# Patient Record
Sex: Male | Born: 1970 | Race: Black or African American | Hispanic: No | Marital: Single | Smoking: Current every day smoker
Health system: Southern US, Community
[De-identification: ages and names within clinical notes are randomized; demographics above are authoritative.]

## PROBLEM LIST (undated history)

## (undated) DIAGNOSIS — I200 Unstable angina: Secondary | ICD-10-CM

## (undated) DIAGNOSIS — I10 Essential (primary) hypertension: Secondary | ICD-10-CM

## (undated) DIAGNOSIS — I2510 Atherosclerotic heart disease of native coronary artery without angina pectoris: Secondary | ICD-10-CM

## (undated) HISTORY — PX: OTHER SURGICAL HISTORY: SHX169

## (undated) HISTORY — DX: Unstable angina: I20.0

## (undated) HISTORY — DX: Essential (primary) hypertension: I10

## (undated) HISTORY — PX: CORONARY STENT PLACEMENT: SHX1402

## (undated) HISTORY — DX: Atherosclerotic heart disease of native coronary artery without angina pectoris: I25.10

---

## 2006-01-30 ENCOUNTER — Emergency Department (HOSPITAL_COMMUNITY): Admission: EM | Admit: 2006-01-30 | Discharge: 2006-01-30 | Payer: Self-pay | Admitting: Emergency Medicine

## 2006-02-05 ENCOUNTER — Emergency Department (HOSPITAL_COMMUNITY): Admission: EM | Admit: 2006-02-05 | Discharge: 2006-02-05 | Payer: Self-pay | Admitting: Emergency Medicine

## 2006-07-20 ENCOUNTER — Emergency Department (HOSPITAL_COMMUNITY): Admission: EM | Admit: 2006-07-20 | Discharge: 2006-07-20 | Payer: Self-pay | Admitting: Emergency Medicine

## 2006-07-22 ENCOUNTER — Emergency Department (HOSPITAL_COMMUNITY): Admission: EM | Admit: 2006-07-22 | Discharge: 2006-07-22 | Payer: Self-pay | Admitting: Emergency Medicine

## 2006-07-25 ENCOUNTER — Observation Stay (HOSPITAL_COMMUNITY): Admission: RE | Admit: 2006-07-25 | Discharge: 2006-07-26 | Payer: Self-pay | Admitting: Family Medicine

## 2006-07-25 ENCOUNTER — Encounter (INDEPENDENT_AMBULATORY_CARE_PROVIDER_SITE_OTHER): Payer: Self-pay | Admitting: Specialist

## 2006-07-25 ENCOUNTER — Emergency Department (HOSPITAL_COMMUNITY): Admission: EM | Admit: 2006-07-25 | Discharge: 2006-07-25 | Payer: Self-pay | Admitting: Family Medicine

## 2007-01-14 ENCOUNTER — Emergency Department (HOSPITAL_COMMUNITY): Admission: EM | Admit: 2007-01-14 | Discharge: 2007-01-14 | Payer: Self-pay | Admitting: Emergency Medicine

## 2007-03-14 ENCOUNTER — Emergency Department (HOSPITAL_COMMUNITY): Admission: EM | Admit: 2007-03-14 | Discharge: 2007-03-14 | Payer: Self-pay | Admitting: Emergency Medicine

## 2007-04-24 ENCOUNTER — Ambulatory Visit: Payer: Self-pay | Admitting: Cardiology

## 2007-05-13 ENCOUNTER — Ambulatory Visit: Payer: Self-pay | Admitting: Cardiology

## 2007-05-13 ENCOUNTER — Ambulatory Visit: Payer: Self-pay

## 2007-05-13 LAB — CONVERTED CEMR LAB
BUN: 10 mg/dL (ref 6–23)
Basophils Absolute: 0 10*3/uL (ref 0.0–0.1)
Basophils Relative: 0.1 % (ref 0.0–1.0)
CO2: 29 meq/L (ref 19–32)
Calcium: 9.2 mg/dL (ref 8.4–10.5)
Chloride: 106 meq/L (ref 96–112)
Creatinine, Ser: 0.9 mg/dL (ref 0.4–1.5)
Eosinophils Absolute: 0.2 10*3/uL (ref 0.0–0.6)
Eosinophils Relative: 1.4 % (ref 0.0–5.0)
GFR calc Af Amer: 123 mL/min
GFR calc non Af Amer: 102 mL/min
Glucose, Bld: 104 mg/dL — ABNORMAL HIGH (ref 70–99)
HCT: 42.3 % (ref 39.0–52.0)
Hemoglobin: 14.9 g/dL (ref 13.0–17.0)
INR: 0.8 (ref 0.8–1.0)
Lymphocytes Relative: 37.1 % (ref 12.0–46.0)
MCHC: 35.1 g/dL (ref 30.0–36.0)
MCV: 92.1 fL (ref 78.0–100.0)
Monocytes Absolute: 1.1 10*3/uL — ABNORMAL HIGH (ref 0.2–0.7)
Monocytes Relative: 10.1 % (ref 3.0–11.0)
Neutro Abs: 5.6 10*3/uL (ref 1.4–7.7)
Neutrophils Relative %: 51.3 % (ref 43.0–77.0)
Platelets: 203 10*3/uL (ref 150–400)
Potassium: 4.1 meq/L (ref 3.5–5.1)
Prothrombin Time: 10.8 s — ABNORMAL LOW (ref 10.9–13.3)
RBC: 4.6 M/uL (ref 4.22–5.81)
RDW: 13.1 % (ref 11.5–14.6)
Sodium: 140 meq/L (ref 135–145)
WBC: 10.9 10*3/uL — ABNORMAL HIGH (ref 4.5–10.5)
aPTT: 31 s — ABNORMAL HIGH

## 2007-05-15 ENCOUNTER — Encounter (INDEPENDENT_AMBULATORY_CARE_PROVIDER_SITE_OTHER): Payer: Self-pay | Admitting: Nurse Practitioner

## 2007-05-15 ENCOUNTER — Ambulatory Visit: Payer: Self-pay | Admitting: Cardiovascular Disease

## 2007-05-15 ENCOUNTER — Ambulatory Visit (HOSPITAL_COMMUNITY): Admission: RE | Admit: 2007-05-15 | Discharge: 2007-05-16 | Payer: Self-pay | Admitting: Cardiovascular Disease

## 2007-05-15 HISTORY — PX: CARDIAC CATHETERIZATION: SHX172

## 2007-05-15 LAB — CONVERTED CEMR LAB
BUN: 10 mg/dL
Creatinine, Ser: 0.88 mg/dL
HCT: 41.9 %
Hemoglobin: 14.3 g/dL
Platelets: 202 10*3/uL
Potassium: 3.7 meq/L
Sodium: 139 meq/L
WBC: 10.3 10*3/uL

## 2007-05-16 ENCOUNTER — Ambulatory Visit: Payer: Self-pay | Admitting: Vascular Surgery

## 2007-05-16 DIAGNOSIS — I200 Unstable angina: Secondary | ICD-10-CM | POA: Insufficient documentation

## 2007-06-04 ENCOUNTER — Ambulatory Visit: Payer: Self-pay | Admitting: Nurse Practitioner

## 2007-06-04 DIAGNOSIS — I10 Essential (primary) hypertension: Secondary | ICD-10-CM | POA: Insufficient documentation

## 2007-06-04 DIAGNOSIS — I2510 Atherosclerotic heart disease of native coronary artery without angina pectoris: Secondary | ICD-10-CM | POA: Insufficient documentation

## 2007-06-04 LAB — CONVERTED CEMR LAB
ALT: 45 units/L (ref 0–53)
AST: 25 units/L (ref 0–37)
Albumin: 4.4 g/dL (ref 3.5–5.2)
Alkaline Phosphatase: 63 units/L (ref 39–117)
BUN: 11 mg/dL (ref 6–23)
CO2: 23 meq/L (ref 19–32)
Calcium: 9.5 mg/dL (ref 8.4–10.5)
Chloride: 104 meq/L (ref 96–112)
Cholesterol: 139 mg/dL (ref 0–200)
Creatinine, Ser: 0.84 mg/dL (ref 0.40–1.50)
Glucose, Bld: 81 mg/dL (ref 70–99)
HDL: 42 mg/dL (ref >39)
LDL Cholesterol: 78 mg/dL (ref 0–99)
Potassium: 4.2 meq/L (ref 3.5–5.3)
Sodium: 140 meq/L (ref 135–145)
Total Bilirubin: 0.5 mg/dL (ref 0.3–1.2)
Total CHOL/HDL Ratio: 3.3
Total Protein: 7.9 g/dL (ref 6.0–8.3)
Triglycerides: 93 mg/dL (ref <150)
VLDL: 19 mg/dL (ref 0–40)

## 2007-06-05 ENCOUNTER — Encounter (INDEPENDENT_AMBULATORY_CARE_PROVIDER_SITE_OTHER): Payer: Self-pay | Admitting: Nurse Practitioner

## 2007-06-11 ENCOUNTER — Ambulatory Visit: Payer: Self-pay | Admitting: Cardiology

## 2007-10-12 ENCOUNTER — Ambulatory Visit: Payer: Self-pay | Admitting: *Deleted

## 2007-11-06 ENCOUNTER — Ambulatory Visit: Payer: Self-pay | Admitting: Nurse Practitioner

## 2007-11-09 ENCOUNTER — Encounter (INDEPENDENT_AMBULATORY_CARE_PROVIDER_SITE_OTHER): Payer: Self-pay | Admitting: Nurse Practitioner

## 2007-11-11 ENCOUNTER — Ambulatory Visit: Payer: Self-pay | Admitting: Cardiology

## 2007-11-11 LAB — CONVERTED CEMR LAB
ALT: 45 units/L (ref 0–53)
AST: 35 units/L (ref 0–37)
Albumin: 3.8 g/dL (ref 3.5–5.2)
Alkaline Phosphatase: 57 units/L (ref 39–117)
Bilirubin, Direct: 0.1 mg/dL (ref 0.0–0.3)
Cholesterol: 158 mg/dL (ref 0–200)
HDL: 43.9 mg/dL (ref >39.0)
LDL Cholesterol: 101 mg/dL — ABNORMAL HIGH (ref 0–99)
Total Bilirubin: 0.5 mg/dL (ref 0.3–1.2)
Total CHOL/HDL Ratio: 3.6
Total Protein: 7.8 g/dL (ref 6.0–8.3)
Triglycerides: 66 mg/dL (ref 0–149)
VLDL: 13 mg/dL (ref 0–40)

## 2008-07-08 ENCOUNTER — Ambulatory Visit: Payer: Self-pay | Admitting: Cardiology

## 2008-09-13 IMAGING — US US ART/VEN ABD/PELV/SCROTUM DOPPLER COMPLETE
1 series · 13 of 25 positions shown · non-contrast
Comparison: none

HISTORY: Left testicular swelling and pain, question torsion

[Series 1: unknown · 0.09mm/px · 13 of 42 slices shown]
[im 1/42]
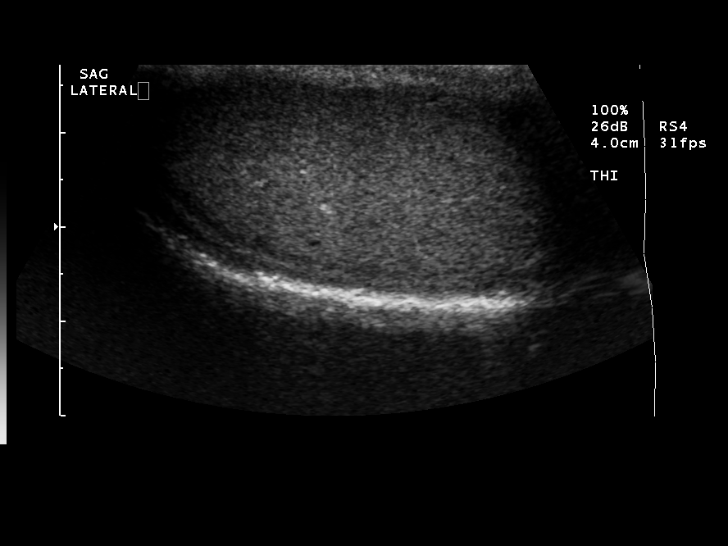
[im 4/42]
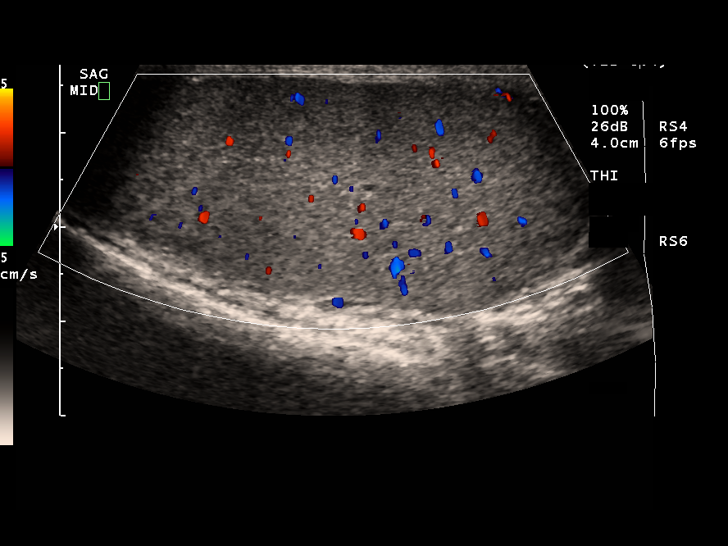
[im 7/42]
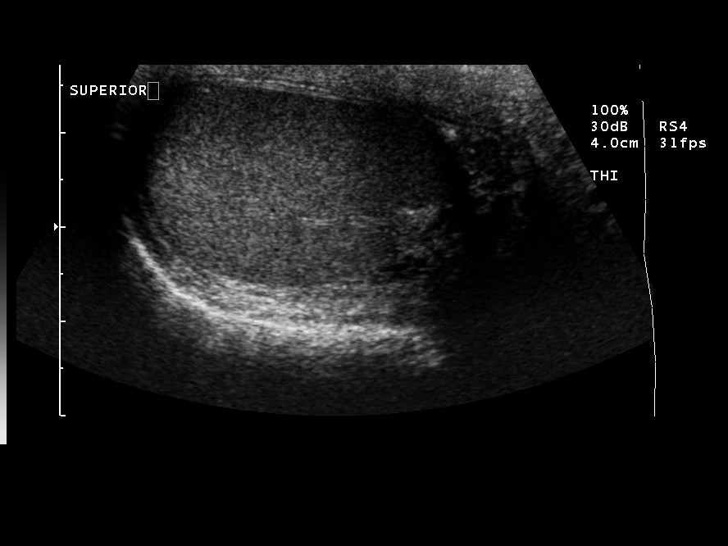
[im 11/42]
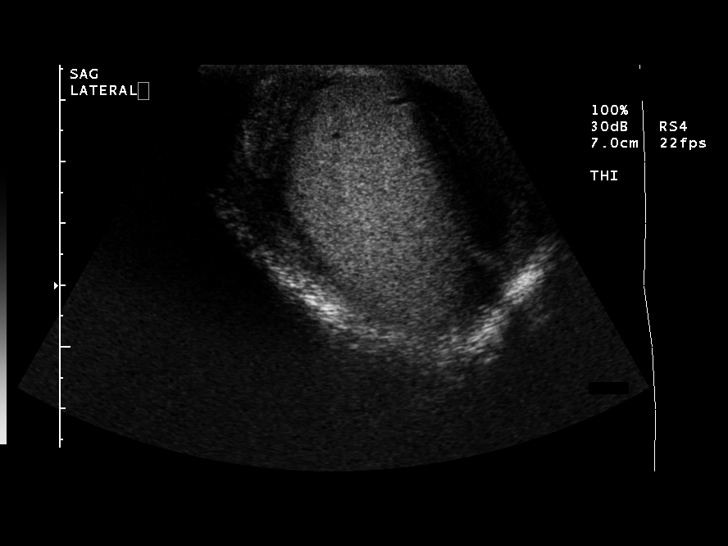
[im 14/42]
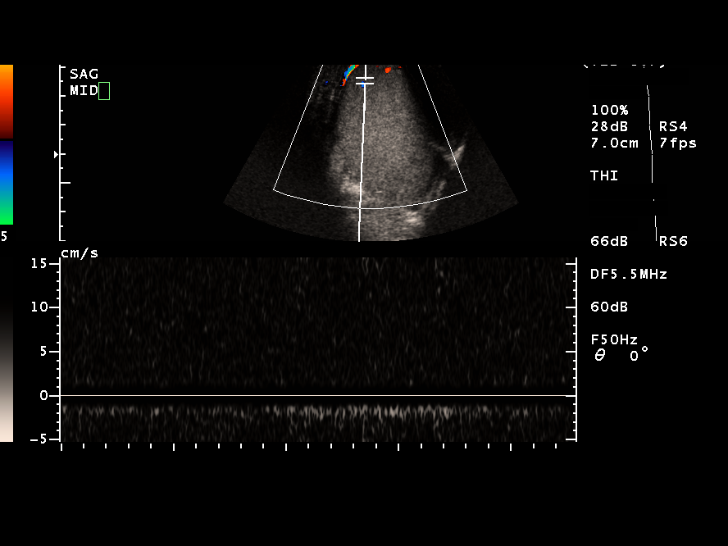
[im 18/42]
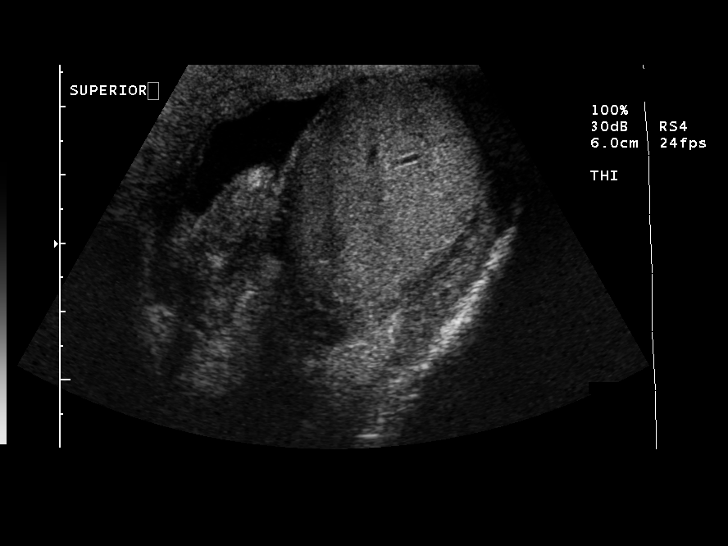
[im 21/42]
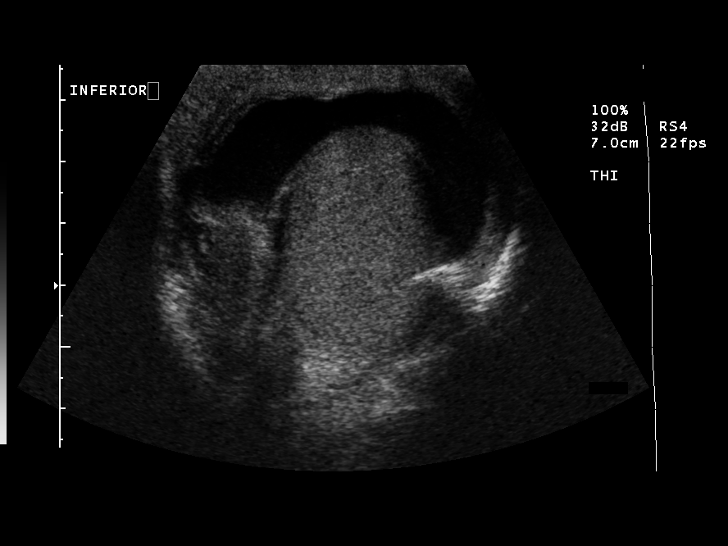
[im 24/42]
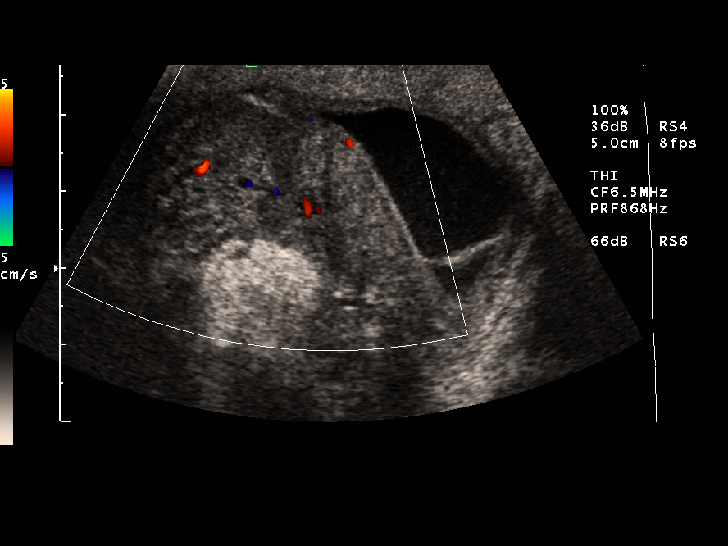
[im 28/42]
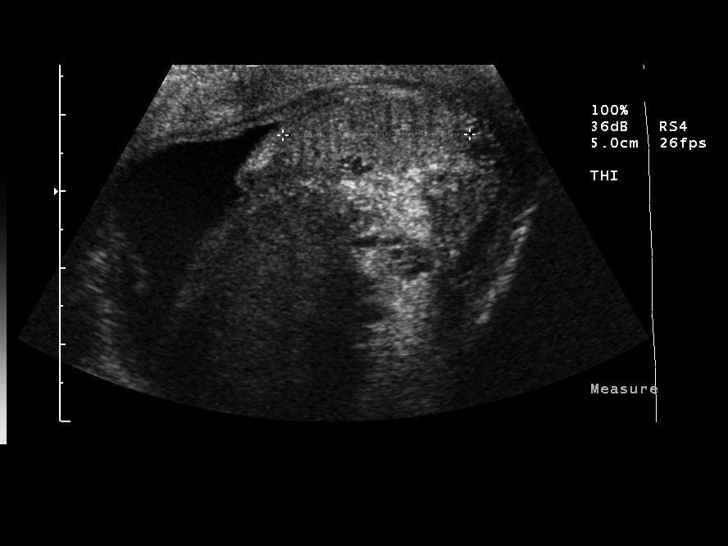
[im 31/42]
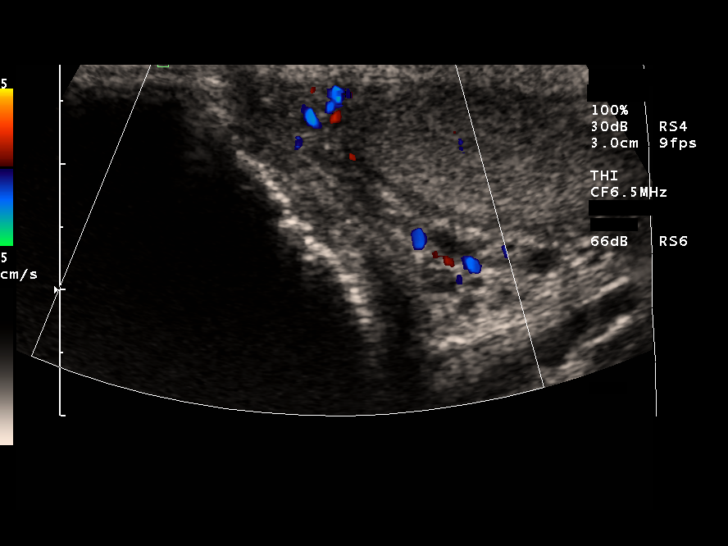
[im 35/42]
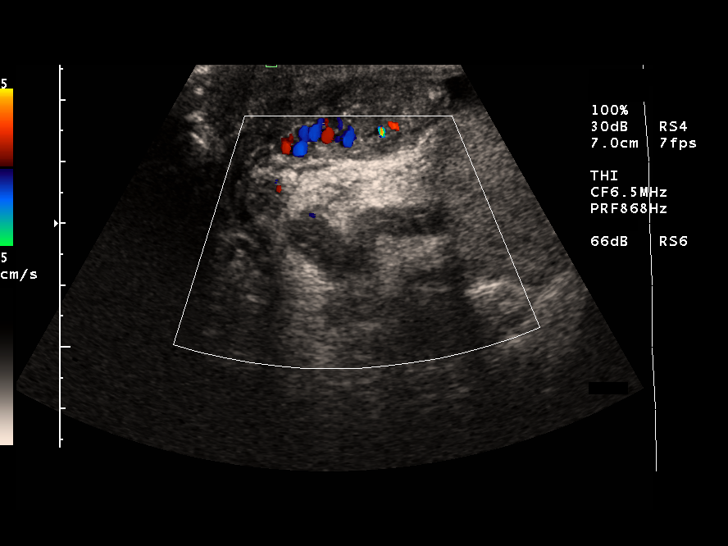
[im 38/42]
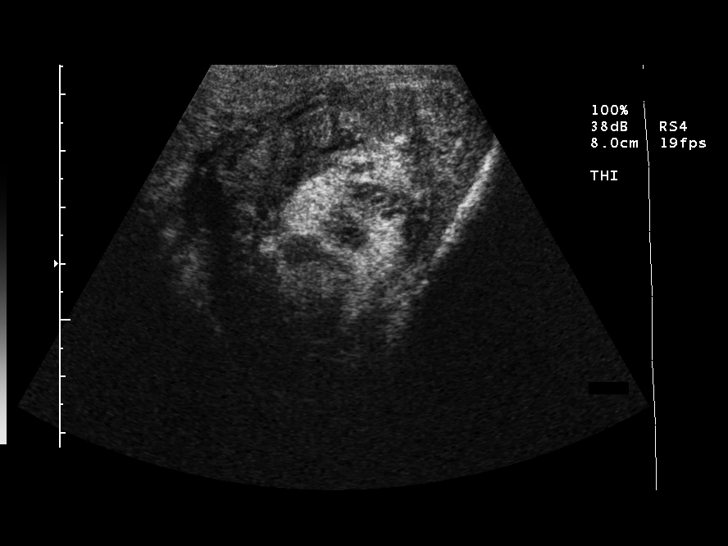
[im 42/42]
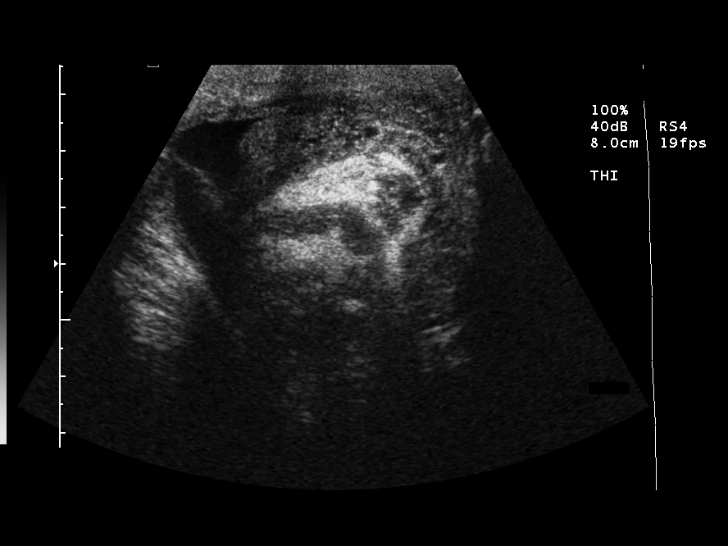

[13 of 25 positions shown; findings below may reference images not displayed]

BILATERAL TESTICULAR ULTRASOUND:
ULTRASOUND ARTERIAL AND VENOUS DUPLEX IMAGING BILATERAL TESTES:

Grayscale, color Doppler, and pulse wave imaging of both testes performed.
The reports from both exams are combined below, in separate paragraphs, for
clarity.

Right testis 5.5 x 5.2 x 3.6 cm.
Left testis 5.1 x 3.5 x 3.1 cm.
Normal symmetric intratesticular blood flow bilaterally.
No evidence of intratesticular mass, calcification, or cyst.
Right epididymis unremarkable.
Diffuse enlargement of left epididymis.
Left hydrocele containing echogenic material/debris.
No right hydrocele.
On left, hyperechoic focus identified adjacent to left epididymis, approximately
3 cm diameter, suspicious for fat.
This could represent a lipoma or fat within a left inguinal hernia.
No peristalsis seen with this abnormality.

Symmetric intratesticular blood flow bilaterally on color Doppler imaging.
No evidence of testicular torsion.
Arterial and venous waveforms present in both testes on pulse wave analysis.
Hyperemia seen in left epididymis compatible with epididymitis.
No varicocele.
IMPRESSION: Left epididymitis.
Complex left hydrocele, cannot exclude pyocele.
Hyperechoic focus extending adjacent to left epididymis, question left inguinal
hernia containing fat.
No evidence of testicular torsion.

## 2008-09-18 IMAGING — US US ART/VEN ABD/PELV/SCROTUM DOPPLER COMPLETE
1 series · 13 of 25 positions shown · non-contrast
Comparison: none

CLINICAL DATA: Left testicle swelling and pain for 5-6 days.
SCROTAL ULTRASOUND:
DOPPLER ULTRASOUND OF THE TESTICLES:
TECHNIQUE: Complete ultrasound examination of the testicles, epididymis, and other scrotal structures was performed.  Color and spectral Doppler ultrasound were also utilized to evaluate blood flow to the testicles.

[Series 1: unknown · 0.11mm/px · 13 of 40 slices shown]
[im 1/40]
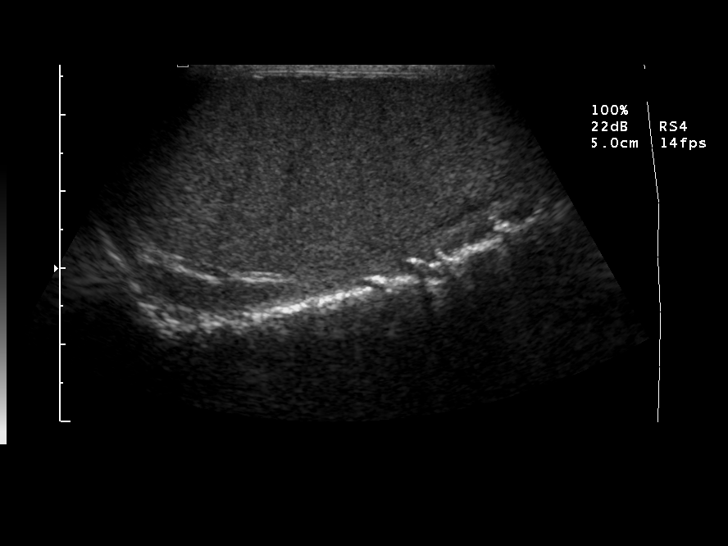
[im 4/40]
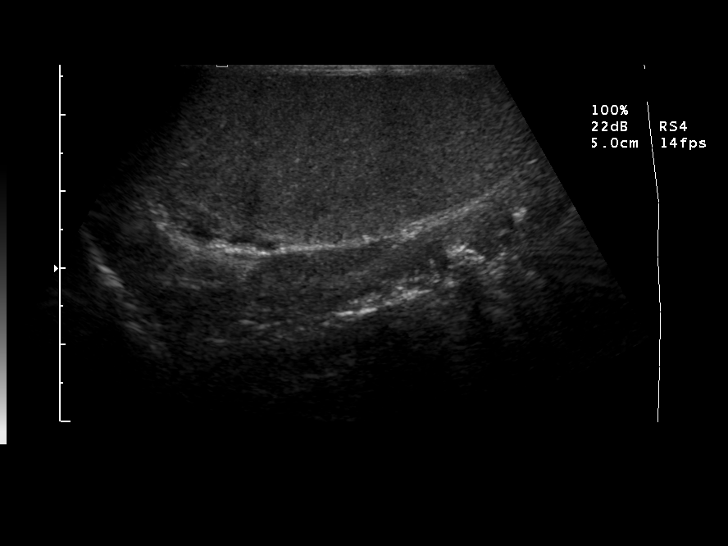
[im 7/40]
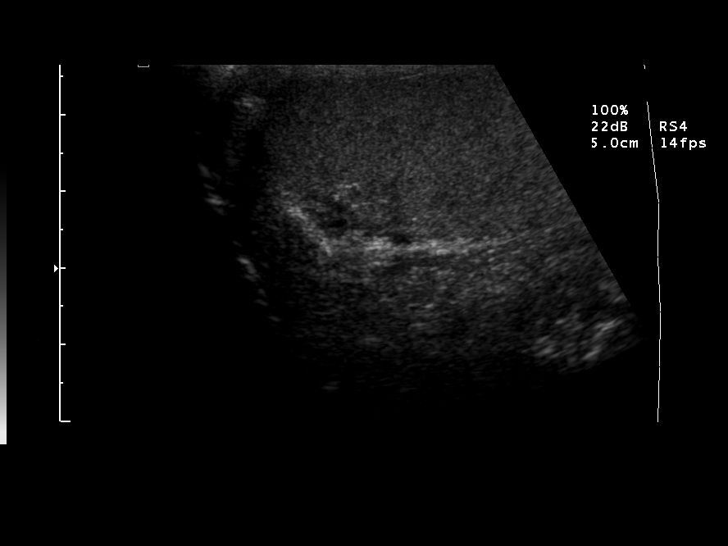
[im 10/40]
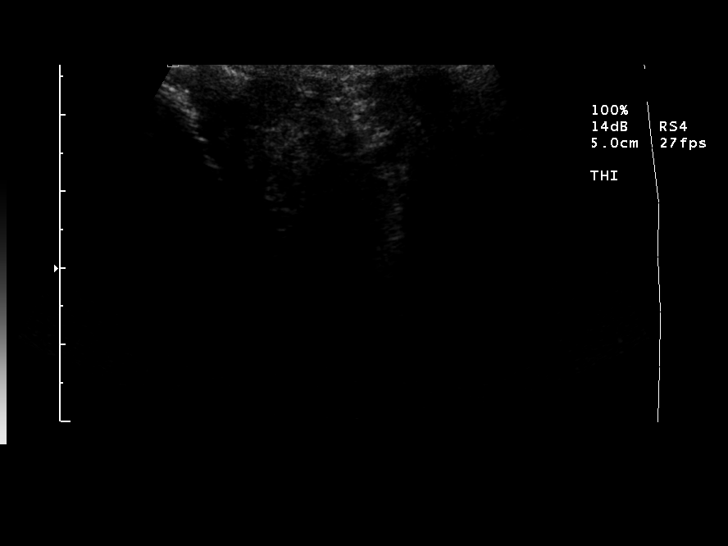
[im 14/40]
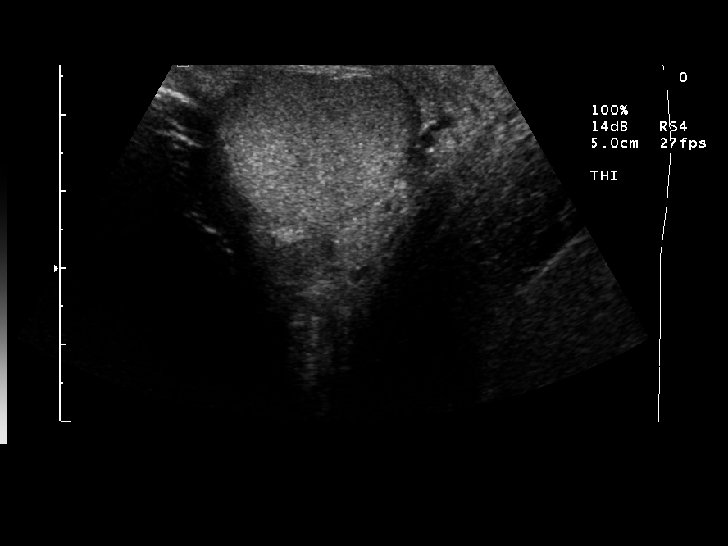
[im 17/40]
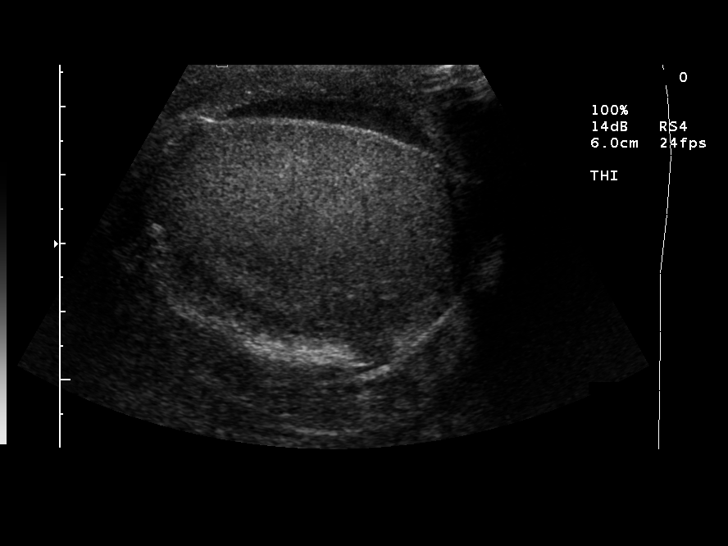
[im 20/40]
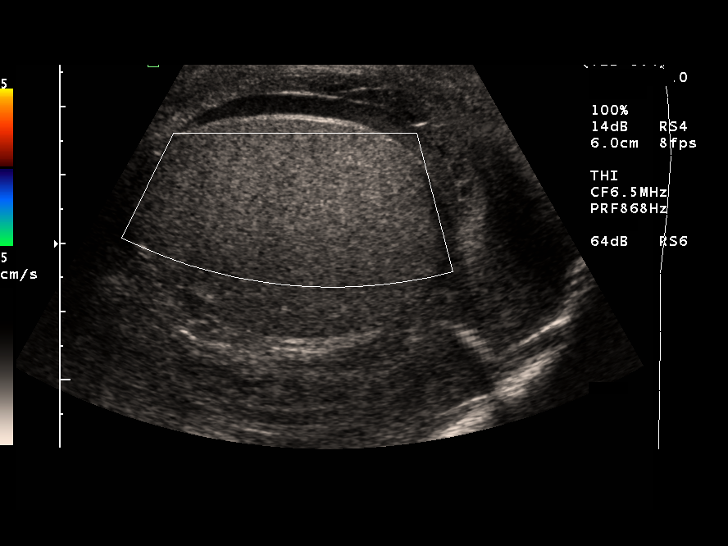
[im 23/40]
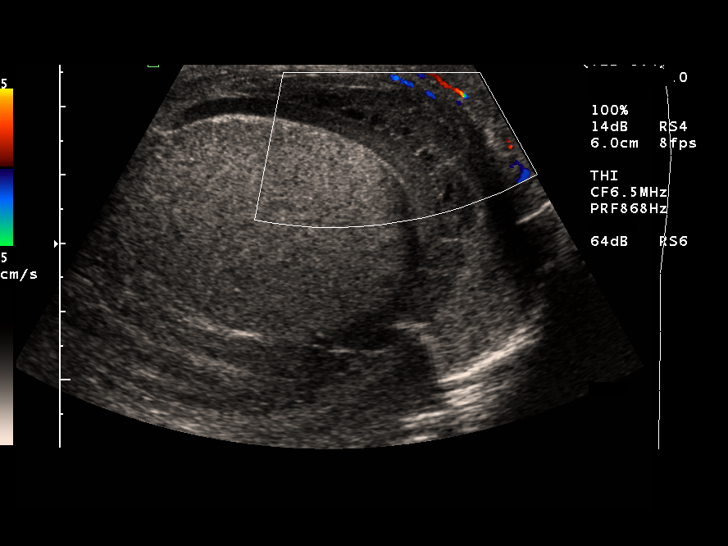
[im 27/40]
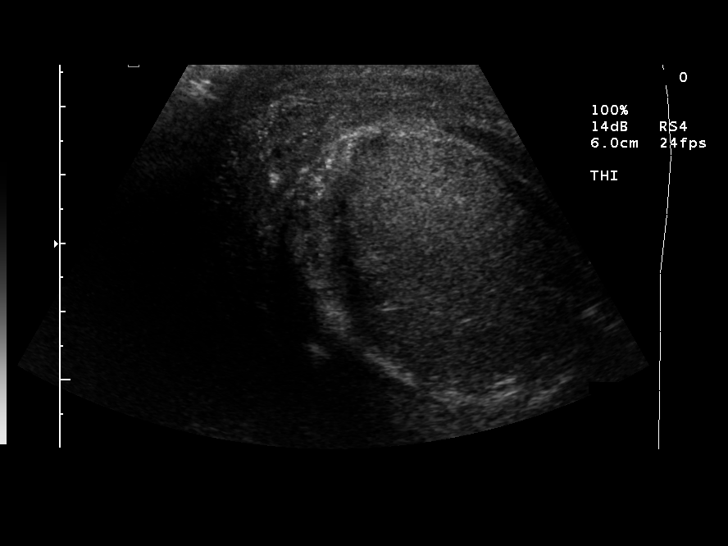
[im 30/40]
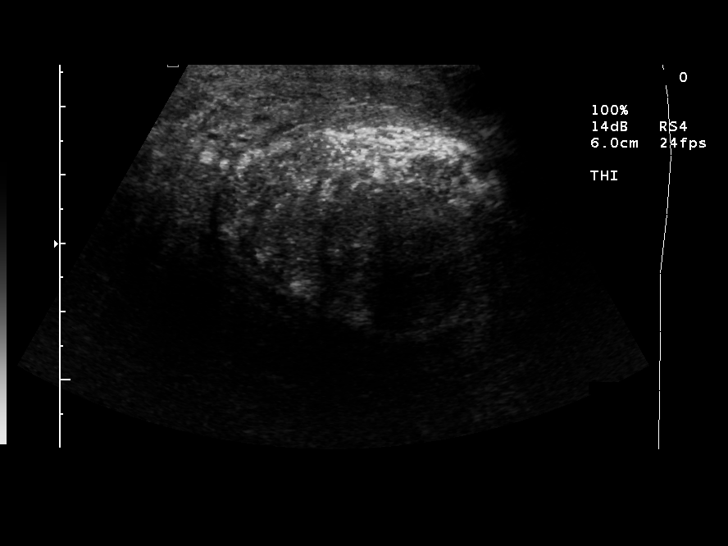
[im 33/40]
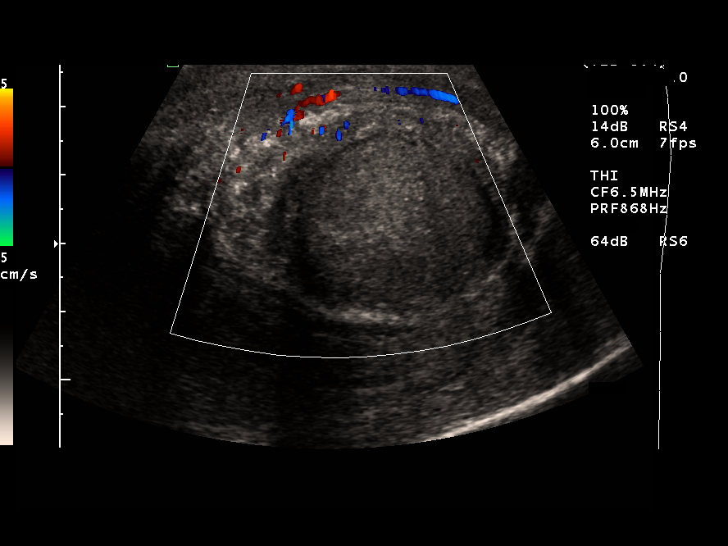
[im 36/40]
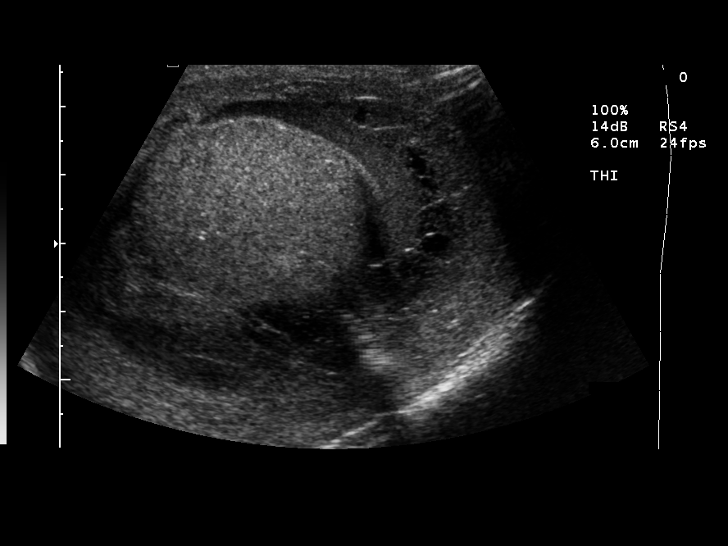
[im 40/40]
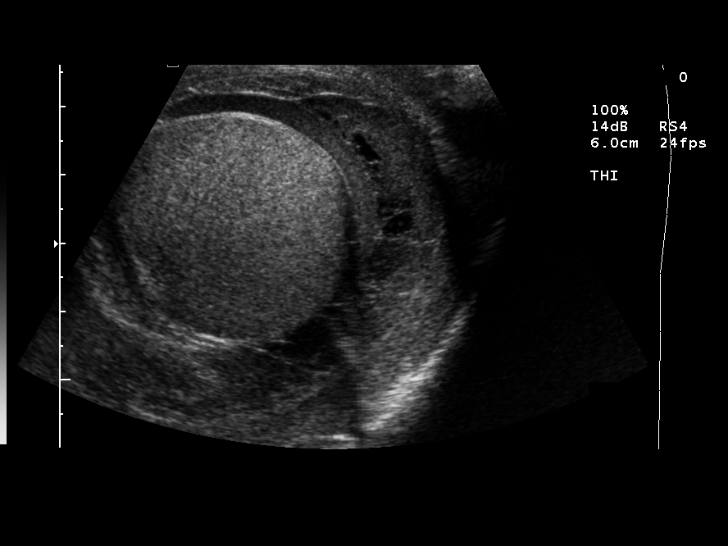

[13 of 25 positions shown; findings below may reference images not displayed]

FINDINGS: The right testicle measures 5.4 cm in length and 2.8 x 3.3 cm in AP and width dimensions. Normal color Doppler flow and arterial and venous wave forms for the right testicle. 
The left testicle measures 5.8 cm in length and 3.1 x 3.1 cm in AP and width dimensions. No color Doppler flow or arterial or venous wave forms for the left testicle. There does appear to be some peripheral flow within the skin of the left scrotum and some flow in the epididymis. There is a complex left hydrocele with multiple small locules and septations. This may represent a pyocele or hematocele. The left testicle appears generally hypoechoic compared to the right testicle. 
Interval change in character of the complex left hydrocele.  There is a more organized appearance now suggesting formation of a pyocele/hematocele measuring approximately 3.0 x 4.2   x 3.2 cm.
IMPRESSION: No evidence of vascular flow to the left testicle. The findings would be compatible with ischemia or infarction of the left testicle. This could be a result of torsion, but also compression causing ischemia in this patient with a complex left hydrocele. 
Note that 07/20/06 ultrasound revealed symmetric intratesticular blood flow bilaterally, whereas, today?s scan reveals no evidence of vascular flow the left testicle.

## 2009-05-02 ENCOUNTER — Encounter (INDEPENDENT_AMBULATORY_CARE_PROVIDER_SITE_OTHER): Payer: Self-pay | Admitting: *Deleted

## 2010-02-06 ENCOUNTER — Emergency Department (HOSPITAL_COMMUNITY): Admission: EM | Admit: 2010-02-06 | Discharge: 2010-02-06 | Payer: Self-pay | Admitting: Emergency Medicine

## 2010-03-20 ENCOUNTER — Emergency Department (HOSPITAL_COMMUNITY): Admission: EM | Admit: 2010-03-20 | Discharge: 2010-03-20 | Payer: Self-pay | Admitting: Family Medicine

## 2010-04-17 ENCOUNTER — Encounter
Admission: RE | Admit: 2010-04-17 | Discharge: 2010-07-16 | Payer: Self-pay | Source: Home / Self Care | Admitting: Orthopedic Surgery

## 2010-04-26 ENCOUNTER — Ambulatory Visit (HOSPITAL_COMMUNITY): Admission: EM | Admit: 2010-04-26 | Discharge: 2010-04-26 | Payer: Self-pay | Admitting: Emergency Medicine

## 2010-09-30 LAB — CONVERTED CEMR LAB
ALT: 35 units/L (ref 0–53)
AST: 22 units/L (ref 0–37)
Albumin: 4.4 g/dL (ref 3.5–5.2)
Alkaline Phosphatase: 67 units/L (ref 39–117)
BUN: 16 mg/dL (ref 6–23)
Basophils Absolute: 0 10*3/uL (ref 0.0–0.1)
Basophils Relative: 0 % (ref 0–1)
Bilirubin Urine: NEGATIVE
Blood in Urine, dipstick: NEGATIVE
CO2: 24 meq/L (ref 19–32)
Calcium: 9.5 mg/dL (ref 8.4–10.5)
Chlamydia, Swab/Urine, PCR: NEGATIVE
Chloride: 106 meq/L (ref 96–112)
Cholesterol: 139 mg/dL (ref 0–200)
Creatinine, Ser: 0.82 mg/dL (ref 0.40–1.50)
Eosinophils Absolute: 0.2 10*3/uL (ref 0.0–0.7)
Eosinophils Relative: 2 % (ref 0–5)
GC Probe Amp, Urine: NEGATIVE
Glucose, Bld: 89 mg/dL (ref 70–99)
Glucose, Urine, Semiquant: NEGATIVE
HCT: 44.6 % (ref 39.0–52.0)
HDL: 44 mg/dL (ref >39)
Hemoglobin: 14.2 g/dL (ref 13.0–17.0)
Ketones, urine, test strip: NEGATIVE
LDL Cholesterol: 78 mg/dL (ref 0–99)
Lymphocytes Relative: 47 % — ABNORMAL HIGH (ref 12–46)
Lymphs Abs: 5.1 10*3/uL — ABNORMAL HIGH (ref 0.7–4.0)
MCHC: 31.8 g/dL (ref 30.0–36.0)
MCV: 94.9 fL (ref 78.0–100.0)
Monocytes Absolute: 0.9 10*3/uL (ref 0.1–1.0)
Monocytes Relative: 8 % (ref 3–12)
Neutro Abs: 4.8 10*3/uL (ref 1.7–7.7)
Neutrophils Relative %: 44 % (ref 43–77)
Nitrite: NEGATIVE
Platelets: 282 10*3/uL (ref 150–400)
Potassium: 4.4 meq/L (ref 3.5–5.3)
Protein, U semiquant: NEGATIVE
RBC: 4.7 M/uL (ref 4.22–5.81)
RDW: 13.9 % (ref 11.5–15.5)
Sodium: 140 meq/L (ref 135–145)
Specific Gravity, Urine: >=1.03
TSH: 1.077 microintl units/mL (ref 0.350–5.50)
Total Bilirubin: 0.3 mg/dL (ref 0.3–1.2)
Total CHOL/HDL Ratio: 3.2
Total Protein: 7.6 g/dL (ref 6.0–8.3)
Triglycerides: 86 mg/dL (ref <150)
Urobilinogen, UA: 0.2
VLDL: 17 mg/dL (ref 0–40)
WBC Urine, dipstick: NEGATIVE
WBC: 10.9 10*3/uL — ABNORMAL HIGH (ref 4.0–10.5)
pH: 5.5

## 2010-10-02 NOTE — Assessment & Plan Note (Signed)
Summary: New pt/CAD   Vital Signs:  Patient Profile:   40 Years Old Male Height:     65.5 inches Weight:      219 pounds BMI:     36.02 BSA:     2.07 Temp:     98.4 degrees F oral Pulse rate:   78 / minute Pulse rhythm:   regular Resp:     18 per minute BP sitting:   132 / 82  (right arm) Cuff size:   regular  Pt. in pain?   no  Vitals Entered By: Gaylyn Cheers RN (June 04, 2007 11:20 AM)              Is Patient Diabetic? No  Does patient need assistance? Ambulation Normal Comments takes med "to thin his blood, starts with P", "something for cholesterol, starts with S" given by Dr. while he was in the hospital  1 ASA 325mg  daily, recently D/C from hospital "had a stent put in"     Chief Complaint:  here to establish.  History of Present Illness: Pt is here today presenting for initial visit to establish care.  he states that he is doing well s/p his hospitalization  Pt was seen in hospital on 05/16/03 for unstable angina.  he had cardiac cath by Dr. Excell Seltzer. Pt had nonobstructive LAD disease and 95% mid circumflex.  stent placed.  pt is to take lifelong ASA and plavix for 30 days.  Pt had u/a secondary to right groin bruit. no evidence of pseudoaneurysm or fistula. Pt was to arrange f/u with Dr. Antoine Poche.  Pt has an appointment on 06/11/07. Pt need labs today as per d/c visit.    Hypertension History:      Positive major cardiovascular risk factors include hypertension and current tobacco user.  Negative major cardiovascular risk factors include male age less than 66 years old and negative family history for ischemic heart disease.        Positive history for target organ damage include ASHD (either angina; prior MI; prior CABG).     Current Allergies: No known allergies   Past Medical History:    HYPERTENSION, BENIGN ESSENTIAL (ICD-401.1)    CORONARY ARTERY DISEASE (ICD-414.00)    UNSTABLE ANGINA (ICD-411.1)      Past Surgical History:    cardiac cath  with stenting (05/15/07)   Social History:    Current Smoker    Drug use-yes (marijuana)   Risk Factors:  Tobacco use:  current    Cigars:  Yes    Counseled to quit/cut down tobacco use:  yes Passive smoke exposure:  yes Drug use:  yes    Substance:  marijuana    Comments:  daily HIV high-risk behavior:  no Caffeine use:  1 drinks per day Alcohol use:  yes    Type:  beer, liquor    Drinks per day:  <1    Has patient --       Felt need to cut down:  yes       Been annoyed by complaints:  no       Felt guilty about drinking:  no       Needed eye opener in the morning:  no    Counseled to quit/cut down alcohol use:  yes Exercise:  no Seatbelt use:  75 % Sun Exposure:  occasionally  Family History Risk Factors:    Family History of MI in females < 49 years old:  no    Family  History of MI in males < 1 years old:  no   Review of Systems  General      Denies chills, fatigue, and fever.  ENT      Denies sore throat.  CV      Denies chest pain or discomfort and weight gain.  Resp      Denies chest discomfort, cough, and wheezing.  GI      Denies abdominal pain, bloody stools, change in bowel habits, constipation, dark tarry stools, diarrhea, excessive appetite, gas, hemorrhoids, indigestion, loss of appetite, nausea, vomiting, vomiting blood, and yellowish skin color.   Physical Exam  General:     alert, well-developed, and overweight-appearing.   Head:     normocephalic.   Eyes:     pupils round.   Neck:     supple.   Lungs:     Normal respiratory effort, chest expands symmetrically. Lungs are clear to auscultation, no crackles or wheezes. Heart:     Normal rate and regular rhythm. S1 and S2 normal without gallop, murmur, click, rub or other extra sounds. Abdomen:     soft, non-tender, and normal bowel sounds.   obese Msk:     up to exam table Neurologic:     alert & oriented X3.      Impression & Recommendations:  Problem # 1:  CORONARY  ARTERY DISEASE (ICD-414.00) Pt is s/p cardiac cath.  He needs to f/u with cardiologist as ordered. His updated medication list for this problem includes:    Plavix 75 Mg Tabs (Clopidogrel bisulfate) .Marland Kitchen... 1 tablet by mouth daily for circulation    Nitroquick 0.4 Mg Subl (Nitroglycerin) .Marland Kitchen... As needed for chest pain  Orders: T-Comprehensive Metabolic Panel [phone]) T-Lipid Profile ([id number])   Problem # 2:  HYPERTENSION, BENIGN ESSENTIAL (ICD-401.1) no current meds. pt to control with diet and exercise.  pt aware that he may need meds in the future if bp continues with elevations.  Complete Medication List: 1)  Plavix 75 Mg Tabs (Clopidogrel bisulfate) .Marland Kitchen.. 1 tablet by mouth daily for circulation 2)  Zocor 40 Mg Tabs (Simvastatin) .Marland Kitchen.. 1 tablet by mouth at night for cholesterol 3)  Nitroquick 0.4 Mg Subl (Nitroglycerin) .... As needed for chest pain  Hypertension Assessment/Plan:      The patient's hypertensive risk group is category C: Target organ damage and/or diabetes.  Today's blood pressure is 132/82.     Patient Instructions: 1)  Follow up with cardiologist (heart doctor) as ordered. 2)  Start low cholesterol diet. 3)  Tobacco is very bad for your health and your loved ones! You Should stop smoking!Marland Kitchen 4)  Continue medication as printed below 5)  Follow here in 3 months unless otherwise instructed by cardiology.    ]

## 2010-10-02 NOTE — Letter (Signed)
Summary: FAXED RECORDS TO [facility] 11/06/07  FAXED RECORDS TO [facility] 11/06/07   Imported By: Silvio Pate Stanislawscyk 11/06/2007 16:26:23  _____________________________________________________________________  External Attachment:    Type:   Image     Comment:   External Document

## 2010-10-02 NOTE — Letter (Signed)
Summary: Discharge Summary  Discharge Summary   Imported By: Arta Bruce 06/05/2007 11:56:56  _____________________________________________________________________  External Attachment:    Type:   Image     Comment:   External Document

## 2010-10-02 NOTE — Letter (Signed)
Summary: Appointment - Reminder 2  [facility], Main Office  [address]   [place], [place] [address]   Phone: [phone]  Fax: [phone]     May 02, 2009 MRN: [id number]   JUSTINE COSSIN [address] [place], [place]  [address]   Dear Mr. RAMAKRISHNAN,  Our records indicate that it is time to schedule a follow-up appointment.  Dr. Antoine Poche  recommended that you follow up with [place] in Nov 2010. It is very important that we reach you to schedule this appointment. We look forward to participating in your health care needs. Please contact us at the number listed above at your earliest convenience to schedule your appointment.  If you are unable to make an appointment at this time, give [place] a call so we can update our records.     Sincerely,    Lorne Skeens  [facility] Scheduling Team

## 2010-10-02 NOTE — Assessment & Plan Note (Signed)
Summary: Complete Physical Exam   Vital Signs:  Patient Profile:   40 Years Old Male Height:     65.5 inches Weight:      220 pounds BMI:     36.18 BSA:     2.07 Temp:     98.3 degrees F oral Pulse rate:   82 / minute Pulse rhythm:   regular Resp:     20 per minute BP sitting:   130 / 90  (left arm) Cuff size:   regular  Pt. in pain?   no  Vitals Entered By: Levon Hedger (November 06, 2007 8:39 AM)              Is Patient Diabetic? No  Does patient need assistance? Ambulation Normal     Visit Type:  Complete Physical Exam PCP:  Lehman Prom FNP  Chief Complaint:  CPE.  History of Present Illness:  Complete Physical Exam - No problem today, only here for CPE.  tetanus - Pt has not gotten tetanus within the past 10 years.  optho - No glasses or contact. No recent optho exam.  dental - no recent dental exam. Pt is requesting to get information for the dental clinic at [facility].  CAD - s/p stenting in 2008 and was last seen by cardiology in 10/0/08.  At that time pt was having no angina.  He is still taking the plavix 75mg  daily.  Pt is notes that he bruises easily with the plavix. Appreciate Dr. Antoine Poche seeing this pt in f/u. Pt states that next appt is on 11/11/07.  No chest pain since his stenting.    Exercise - pt was recommended to exercise 5 days per week to reduce risk.  He is also on a statin and will check lipid panel today.  1 pound weight gain since the last visit here.   When questioned about tobacco use pt seem very vaque on the answer but eventually says that since he had the procedure he has not smoked.  blood pressure - currently no meds and pt is to manage with lifestyle changes. Will need to monitor for possible medication additions.     Prior Medication List:  ZOCOR 40 MG  TABS (SIMVASTATIN) 1 tablet by mouth at night for cholesterol NITROQUICK 0.4 MG  SUBL (NITROGLYCERIN) as needed for chest pain   Current Allergies: No known  allergies     Risk Factors: Tobacco use:  current    Cigars:  Yes Passive smoke exposure:  yes Drug use:  yes    Substance:  marijuana HIV high-risk behavior:  no Caffeine use:  1 drinks per day Alcohol use:  yes    Type:  beer, liquor    Drinks per day:  <1 Exercise:  no Seatbelt use:  75 % Sun Exposure:  occasionally  Family History Risk Factors:    Family History of MI in females < 25 years old:  no    Family History of MI in males < 67 years old:  no   Review of Systems  General      Denies chills, fatigue, and fever.  Eyes      Denies discharge.  ENT      Denies earache and sore throat.  CV      Denies chest pain or discomfort, fatigue, and shortness of breath with exertion.  Resp      Denies cough and shortness of breath.  GI      Denies abdominal pain, nausea,  and vomiting.  GU      Denies urinary frequency and urinary hesitancy.  MS      Denies joint pain.  Derm      Denies dryness and rash.  Neuro      Denies headaches.  Psych      Denies anxiety and depression.  Endo      Denies excessive hunger, excessive thirst, and excessive urination.  Heme      Complains of skin discoloration.      Denies bleeding.      easy bruising   Physical Exam  General:     alert and overweight-appearing.    Head:     normocephalic.   Eyes:     pupils round.   Ears:     R ear normal and L ear normal.   Nose:     no external deformity.   Mouth:     fair dentition.  plaque formation Neck:     supple and no thyromegaly.   Chest Wall:     no deformities and no tenderness.   Breasts:     no masses.   Lungs:     normal respiratory effort, no intercostal retractions, no accessory muscle use, and normal breath sounds.   Heart:     normal rate, regular rhythm, and no murmur.   Abdomen:     soft, non-tender, and normal bowel sounds.  obese Rectal:     refused Genitalia:     refused Prostate:     refused Msk:     up to the exam  table  Pulses:     R radial normal and L radial normal.   Extremities:     no edema. Neurologic:     alert & oriented X3.   Skin:     color normal.   Cervical Nodes:     no anterior cervical adenopathy and no posterior cervical adenopathy.   Axillary Nodes:     no R axillary adenopathy and no L axillary adenopathy.   Psych:     Oriented X3 and good eye contact.      Impression & Recommendations:  Problem # 1:  HEALTH MAINTENANCE EXAM (ICD-V70.0) Labs up dated. EKG done. Tdap given. dental info given needs optho exam refused rectal/genital exam Orders: UA Dipstick w/o Micro (manual) (40981) EKG w/ Interpretation (93000) T-General Health Panel (CBCD, CMP, TSH) ([id number]) T-Lipid Profile (62130-86578) T-GC Probe, urine ([id number]) T-HIV Antibody  (Reflex) ([id number]) T-Syphilis Test (RPR) (25366-44034)   Problem # 2:  CORONARY ARTERY DISEASE (ICD-414.00) Appropriately follow-ed up with cardiology last in 10/08.   Will check lipid panel today. His updated medication list for this problem includes:    Nitroquick 0.4 Mg Subl (Nitroglycerin) .Marland Kitchen... As needed for chest pain    Plavix 75 Mg Tabs (Clopidogrel bisulfate) .Marland Kitchen... 1 tablet by mouth daily   Complete Medication List: 1)  Zocor 40 Mg Tabs (Simvastatin) .Marland Kitchen.. 1 tablet by mouth at night for cholesterol 2)  Nitroquick 0.4 Mg Subl (Nitroglycerin) .... As needed for chest pain 3)  Plavix 75 Mg Tabs (Clopidogrel bisulfate) .Marland Kitchen.. 1 tablet by mouth daily  Other Orders: Tdap => 3yrs IM (74259) Admin 1st Vaccine (56387)   Patient Instructions: 1)  Follow up in 6 months or sooner if needed.    ] Laboratory Results   Urine Tests  Date/Time Received: November 06, 2007 8:49 AM Date/Time Reported: November 06, 2007 8:50 AM   Routine Urinalysis   Color: lt. yellow  Appearance: Clear Glucose: negative   (Normal Range: Negative) Bilirubin: negative   (Normal Range: Negative) Ketone: negative   (Normal Range:  Negative) Spec. Gravity: >=1.030   (Normal Range: 1.003-1.035) Blood: negative   (Normal Range: Negative) pH: 5.5   (Normal Range: 5.0-8.0) Protein: negative   (Normal Range: Negative) Urobilinogen: 0.2   (Normal Range: 0-1) Nitrite: negative   (Normal Range: Negative) Leukocyte Esterace: negative   (Normal Range: Negative)        Tetanus/Td Vaccine    Vaccine Type: Tdap    Site: right deltoid    Mfr: [employer]i Pasteur    Dose: 0.5 ml    Route: IM    Exp. Date: 10/08/2009    Lot #: [id number]    VIS given: 07/21/07 version given November 06, 2007. NDC# 53664-403-47

## 2010-10-02 NOTE — Letter (Signed)
Summary: *HSN Results Follow up  [facility]  [address] [place], [place] [address]   Phone: [phone]  Fax: [phone]      06/05/2007   [name] [name] [address] [place], [place]  [address]   Dear  Mr. [name],                            ____S.Drinkard,FNP   ____D. Gore,FNP       ____B. McPherson,MD   ____V. Rankins,MD    ____E. Mulberry,MD    __X__N. Daphine Deutscher, FNP  ____D. Reche Dixon, MD    ____K. Philipp Deputy, MD    ____Other     This letter is to inform you that your recent test(s):  _______Pap Smear    ____X___Lab Test     _______X-ray    __X_____ is within acceptable limits  _______ requires a medication change  _______ requires a follow-up lab visit  _______ requires a follow-up visit with your [name]   Comments:  The blood work done in office on 06/04/07 was normal. This office will fax a copy to Dr. Antoine Poche (heart doctor) for his review to have during your visit later this month.  Continue to take your medications daily.       _________________________________________________________ If you have any questions, please contact our office [phone]                    Sincerely,    Lehman Prom FNP [facility]

## 2010-10-02 NOTE — Letter (Signed)
Summary: *HSN Results Follow up  [facility]  [address] [place], [place] [address]   Phone: [phone]  Fax: [phone]      11/09/2007   [name] [name] [address] [place], [place]  [address]   Dear  Mr. [name] [name],                            ____S.Drinkard,FNP   ____D. Gore,FNP       ____B. McPherson,MD   ____V. Rankins,MD    ____E. Mulberry,MD    __X__N. Daphine Deutscher, FNP  ____D. [name] Dixon, MD    ____K. [name] Deputy, MD    ____Other     This letter is to inform you that your recent test(s):  _______Pap Smear    ___X____Lab Test     _______X-ray    ___X____ is within acceptable limits  _______ requires a medication change  _______ requires a follow-up lab visit  _______ requires a follow-up visit with your [name] [name]   Comments:  Labs done during your recent office visit were all ok.  Continue medications as ordered.       _________________________________________________________ If you have any questions, please contact our office [phone].                    Sincerely,    [name] Prom FNP [facility]

## 2010-10-02 NOTE — Letter (Signed)
 Summary: patient information  patient information   Imported By: Jola Nash 06/05/2007 11:54:03  _____________________________________________________________________  External Attachment:    Type:   Image     Comment:   External Document

## 2010-11-19 LAB — RAPID URINE DRUG SCREEN, HOSP PERFORMED
Amphetamines: NOT DETECTED
Barbiturates: NOT DETECTED
Benzodiazepines: NOT DETECTED
Cocaine: NOT DETECTED
Opiates: NOT DETECTED
Tetrahydrocannabinol: POSITIVE — AB

## 2010-11-19 LAB — BASIC METABOLIC PANEL
BUN: 8 mg/dL (ref 6–23)
CO2: 25 mEq/L (ref 19–32)
Calcium: 9.2 mg/dL (ref 8.4–10.5)
Chloride: 108 mEq/L (ref 96–112)
Creatinine, Ser: 0.91 mg/dL (ref 0.4–1.5)
GFR calc Af Amer: >60 mL/min (ref >60)
GFR calc non Af Amer: >60 mL/min (ref >60)
Glucose, Bld: 99 mg/dL (ref 70–99)
Potassium: 3.8 mEq/L (ref 3.5–5.1)
Sodium: 138 mEq/L (ref 135–145)

## 2010-11-19 LAB — CBC
HCT: 40.8 % (ref 39.0–52.0)
Hemoglobin: 14.1 g/dL (ref 13.0–17.0)
MCHC: 34.5 g/dL (ref 30.0–36.0)
MCV: 92.7 fL (ref 78.0–100.0)
Platelets: 210 10*3/uL (ref 150–400)
RBC: 4.4 MIL/uL (ref 4.22–5.81)
RDW: 12.8 % (ref 11.5–15.5)
WBC: 8 10*3/uL (ref 4.0–10.5)

## 2010-11-19 LAB — DIFFERENTIAL
Basophils Absolute: 0 10*3/uL (ref 0.0–0.1)
Basophils Relative: 0 % (ref 0–1)
Eosinophils Absolute: 0.1 10*3/uL (ref 0.0–0.7)
Eosinophils Relative: 1 % (ref 0–5)
Lymphocytes Relative: 33 % (ref 12–46)
Lymphs Abs: 2.7 10*3/uL (ref 0.7–4.0)
Monocytes Absolute: 0.8 10*3/uL (ref 0.1–1.0)
Monocytes Relative: 10 % (ref 3–12)
Neutro Abs: 4.4 10*3/uL (ref 1.7–7.7)
Neutrophils Relative %: 56 % (ref 43–77)

## 2010-11-19 LAB — TRICYCLICS SCREEN, URINE: TCA Scrn: NOT DETECTED

## 2010-11-19 LAB — ETHANOL: Alcohol, Ethyl (B): <5 mg/dL (ref 0–10)

## 2011-01-15 NOTE — Procedures (Signed)
[facility]                              EXERCISE TREADMILL   KIEL, COCKERELL                      MRN:          [id number]  DATE:05/13/2007                            DOB:          February 01, 1971    This is a 40 year old African-American male patient with atypical chest  pain.  Patient exercised 6 minutes with the Bruce protocol.  The test  was stopped due to fatigue and shortness of breath.  He had no chest  pain.  He obtained a heart rate of 113, and 85% of his maximum predicted  heart rate was 157.   Patient developed 2 mm ST depression inferolaterally and had couplets.  He also had a hypertensive response to exercise.   Blood pressure at rest 132/90.  Jumped to 191/82 at peak exercise.   EKG changes resolved post exercise.  He still had some T wave inversion  in lead III, a little bit more prominent than baseline.  Again, he was  painfree.   IMPRESSION:  Abnormal stress test with hypertensive response and 2 mm ST  depression.  Cardiac catheterization recommended by Dr. Antoine Poche and  scheduled for April 14, 2007.      Jacolyn Reedy, PA-C  Electronically Signed      Rollene Rotunda, MD, [facility]  Electronically Signed   ML/MedQ  DD: 05/13/2007  DT: 05/14/2007  Job #: [id number]

## 2011-01-15 NOTE — Cardiovascular Report (Signed)
NAMEJAMAIL, [name] [name]               ACCOUNT NO.:  [id number]   MEDICAL RECORD NO.:  [id number]          PATIENT TYPE:  OIB   LOCATION:  6533                         FACILITY:  [facility]   PHYSICIAN:  Veverly Fells. Excell Seltzer, MD  DATE OF BIRTH:  September 07, 1970   DATE OF PROCEDURE:  05/15/2007  DATE OF DISCHARGE:  05/16/2007                            CARDIAC CATHETERIZATION   PROCEDURE:  1. Left heart catheterization.  2. Selective coronary angiography.  3. Left ventricular angiography.  4. Percutaneous transluminal coronary angioplasty and stenting of the      left circumflex.  5. Starclose of the right femoral artery.   INDICATIONS:  [name] [name] is a 40 year old gentleman who has had  nearly ongoing atypical chest pain.  He underwent an exercise treadmill  test that was concerning for ischemia with 2-3 mm of ST depression at  low-level exercise.  He was referred for cardiac catheterization.   Risks and indications of procedure were reviewed with the patient.  Informed consent was obtained.  The right groin was prepped, draped and  anesthetized with 1% lidocaine.  Using the modified Seldinger technique,  a 6-French sheath was placed in the right femoral artery.  Multiple  views of the left and right coronary arteries were taken using standard  Judkins catheters.  Following selective coronary angiography, an angled  pigtail catheter was inserted in the left ventricle where pressures were  recorded.  Left ventriculogram was performed.  A pullback across the  aortic valve was done.   At the conclusion of the diagnostic procedure, I elected to intervene on  the left circumflex.  There was severe stenosis in the mid left  circumflex.  Left circumflex is a dominant vessel, and the stenosis was  proximal to the left PDA and two large posterolateral branches.  Mr.  [name] had minimal disease elsewhere.  Angiomax was used for  anticoagulation. Six hundred milligrams of clopidogrel was given on  the  table.  An XB-4 guide catheter, 6-French guide catheter was inserted,  and angiographic images were again taken.  Once a therapeutic ACT was  achieved, a Cougar guidewire was passed beyond the lesion.  A 2.5 x 12-  mm Fire-Star balloon was inserted and inflated to 8 atmospheres.  Following predilatation, the lesion expanded well.  After intracoronary  nitroglycerin, it was apparent that the left circumflex was a very large  vessel.  There was a long segment of diffuse disease surrounding both  sides of the severe 95% focal stenosis.  I elected to cover the entire  area with a 3.5 x 28-mm Liberte stent.  The stent crossed a large OM  branch.  The origin of the OM did not have much disease, and I thought  it would stay open.  Therefore, I crossed the branch and covered the  entire area of disease.  The stent was deployed at 14 atmospheres.  It  was well expanded.  The stented segment was then postdilated with a 4-0  x 20-mm Dura Star balloon up to 16 atmospheres on 2 inflations.  This  covered the entire stented segment.  Following stenting,  there was  excellent TIMI-3 flow throughout the vessel.  The stent was well  expanded.  The OM branch was widely patent.  The patient tolerated the  procedure well, and a Starclose device was used to seal the femoral  arteriotomy.   FINDINGS:  Aortic pressure 136/85 with a mean of 109, left ventricular  pressure is 132/8.   The left mainstem is very short.  It is angiographically normal.  It  bifurcates into the LAD and left circumflex.   The LAD is a large-caliber vessel that courses down and wraps around the  LV apex.  There is minimal nonobstructive plaque in the proximal  portion.  The first diagonal is a large-caliber vessel that has no  significant angiographic stenosis.  The second diagonal is small.  There  is a large first perforator.  There is no significant obstructive  lesions throughout the LAD or diagonal branches.   The left  circumflex is severely diseased.  The proximal portion of the  vessel is large.  There is a large first OM that branches into twin  vessels.  The OM branch has a 30% stenosis at its origin.  Just beyond  the OM branch, there is severe 95% stenosis in the mid circumflex.  The  distal circumflex is widely patent and is dominant.  It supplies two PL  branches and a large left PDA branch.  There is no other significant  angiographic stenosis after the area of high-grade disease in the mid  circumflex.   The right coronary artery is small and nondominant.  There is no  significant angiographic stenosis.   Left ventricular function assessed by 30-degree RAO ventriculography is  normal.  The LVEF is estimated at 60%.   There is some mitral regurgitation, but I think it is due to catheter  placement.   ASSESSMENT:  1. Severe left circumflex stenosis.  2. Minimal nonobstructive left anterior descending stenosis.  3. Normal, nondominant right coronary artery.  4. Normal left ventricular systolic function.  5. Successful percutaneous coronary intervention of the left      circumflex with a bare-metal stent.   DISCUSSION:  [name] [name] will need lifelong aspirin and 30 days of  Plavix.  He will need aggressive risk reduction with significant  coronary disease at his 40 age.  He will be kept overnight and  planned for discharge home tomorrow.      Veverly Fells. Excell Seltzer, MD  Electronically Signed     MDC/MEDQ  D:  05/15/2007  T:  05/16/2007  Job:  [id number]   cc:   Rollene Rotunda, MD, [facility]

## 2011-01-15 NOTE — Assessment & Plan Note (Signed)
[facility]                            CARDIOLOGY OFFICE NOTE   NAME:[name] [name], [name] [name]                      MRN:          [id number]  DATE:11/11/2007                            DOB:          07/25/1971    PRIMARY CARE PHYSICIAN:  None   REASON FOR PRESENTATION:  Evaluate patient with coronary disease status  post stenting.   HISTORY OF PRESENT ILLNESS:  The patient is 40 years old. He presents  for followup. He has had no problems since I last saw him.  He denies  any chest discomfort, neck or arm discomfort.  He is had no  palpitations, pre-syncope or syncope.  He might get short of breath if  he overexerts himself, but this is not routine.  He is unfortunately not  exercising as much as I would like.  He says he does try to watch what  he eats.  Has not been smoking cigarettes.   PAST MEDICAL HISTORY:  1. Coronary artery disease (status post abnormal stress perfusion      study.  A subsequent cardiac catheterization demonstrating 95%      circumflex stenosis treated bare metal stenting, 30% obtuse      marginal stenosis.  His ejection fraction was 60%.  This was      September 2008.)  2. Previous cigarette use.  3. Ongoing marijuana use.   ALLERGIES:  None.   MEDICATIONS:  1. Plavix 75 mg daily.  2. Aspirin 325 mg daily.  3. Crestor (patient is not clear on the dose).   REVIEW OF SYSTEMS:  As stated in HPI, otherwise negative for other  systems.   PHYSICAL EXAMINATION:  The patient is in no distress.  Blood pressure  126/82, heart rate 60 and regular, weight 230 pounds, body mass index  36.  HEENT: Eyelids unremarkable. Pupils equal, round, and reactive to light.  Fundi not visualized. Oral mucosa is unremarkable.  NECK: No jugular venous distention at 45 degrees. Carotid upstroke brisk  and symmetrical. No bruits, no thyromegaly.  LYMPHATICS: No adenopathy.  LUNGS: Clear to auscultation bilaterally.  BACK: No costovertebral angle  tenderness.  CHEST: Unremarkable.  HEART: PMI not displaced or sustained. S1, S2 within normal limits. No  S3. No S4. No clicks, rub or murmurs.  ABDOMEN: Obese, positive bowel sounds, normal in frequency and pitch. No  bruits. No rebound. No guarding. No midline pulsatile mass. No  organomegaly.  SKIN: No rashes, no nodules.  EXTREMITIES: 2+ pulses throughout. No edema.   EKG: Sinus rhythm, rate 72, axis within normal limits, QT mildly  prolonged, nonspecific inferior T-wave changes, no significant change  from previous EKGs except a QT slightly longer.   ASSESSMENT/PLAN:  1. Coronary disease. Is having no ongoing symptoms.  He is not      participating as aggressively as I would like in secondary risk      reduction.  We had a long discussion about this.  Hopefully he will      comply.  I told him he could come off of Plavix, but he wants to  continue this.  He understands there is no added benefit with his      bare metal stent at this point.  2. Dyslipidemia.  The patient will get a lipid profile today as he has      been fasting.  He will have to call and tell us how much Crestor he      has been taking.  3. Obesity.  He understands the need to lose weight with diet,      exercise.  4. Follow-up.  Will see him back in 6 months or sooner if needed.     Rollene Rotunda, MD, [facility]  Electronically Signed    JH/MedQ  DD: 11/11/2007  DT: 11/12/2007  Job #: [id number]   cc:   Melvern Banker

## 2011-01-15 NOTE — Assessment & Plan Note (Signed)
[facility]                            CARDIOLOGY OFFICE NOTE   NAME:[name] [name] DISPENZA                      MRN:          [id number]  DATE:04/24/2007                            DOB:          10-24-1970    PRIMARY CARE PHYSICIAN:  None.   REASON FOR PRESENTATION:  Evaluate patient with chest discomfort.   HISTORY OF PRESENT ILLNESS:  The patient presented to the emergency room  in mid July with chest discomfort.  He apparently had no evidence of  ischemia.  He was sent home with prescription for Prilosec and  sublingual nitroglycerin.  He has not taken the nitroglycerin.  He took  the Prilosec only for a few days.  He said the chest discomfort had been  going on for weeks before that and has been going on since then.  It is  a stable frequency and intensity.  He gets it every other day or so.  It  may last for 5-20 minutes.  He cannot qualify it, but does not think it  is sharp.  He thinks it is more like a gripping pain.  It is not  associated with nausea, vomiting, or diaphoresis.  It is substernal but  does not radiate to his neck or to his arms.  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  It may be  5 out of 10 in intensity.  It goes away on its own.   PAST MEDICAL HISTORY:  The patient has no history of hypertension,  diabetes, or hyperlipidemia.   PAST SURGICAL HISTORY:  Left testicle removed, apparently, secondary to  tortion.   ALLERGIES:  None.   MEDICATIONS:  Currently none.   SOCIAL HISTORY:  The patient smokes 1 pack a day, cigarettes, for 10  years.  He also smokes marijuana, but denies any other recreational  drugs.  He drinks alcohol.   FAMILY HISTORY:  Noncontributory for early coronary artery disease.   REVIEW OF SYSTEMS:  As stated in the HPI and otherwise negative for  other systems.   PHYSICAL EXAMINATION:  Patient is in no distress.  Blood pressure 127/83, heart rate 81 and  regular, weight 218 pounds.  HEENT:  Eyelids unremarkable, pupils are equal, round, and reactive to  light, fundi not visualized.  Oral mucosa unremarkable.  NECK:  No jugular venous distension at 45 degrees, carotid upstroke  brisk and symmetrical.  No bruit.  No thyromegaly.  LYMPHATICS:  No cervical, axillary, inguinal adenopathy.  LUNGS:  Clear to auscultation bilaterally.  BACK:  No costovertebral angle tenderness.  CHEST:  Unremarkable.  HEART:  PMI not displaced or sustained.  S1 and S2 are within normal  limits.  No S3, no S4.  No clicks, no rubs, no murmurs.  ABDOMEN:  Obese, positive bowel sounds, normal in frequency and pitch.  No bruits, no rebound, no guarding.  No midline pulsatile mass.  No  hepatomegaly, no splenomegaly.  SKIN:  No rashes, no nodules.  EXTREMITIES:  2+ pulses throughout, no edema.  No cyanosis, no clubbing.  NEURO:  Oriented to person, place, and time.  Cranial nerves II-XII  grossly intact, motor grossly intact.   EKG (March 14, 2007) sinus rhythm, rate 79, axis within normal limits,  mild QT prolongation, nonspecific inferior T-wave flattening.   ASSESSMENT AND PLAN:  1. Chest discomfort:  The patient's chest discomfort is atypical.      However, he has some nonspecific EKG findings.  It is a continuing      discomfort.  I think the pretest probability of obstructive      coronary disease is low.  Screening with an exercise treadmill test      would be indicated.  2. Tobacco:  The patient is cautioned about the need to stop smoking      cigarettes as well as other substance abuse.  3. Obesity:  The patient is cautioned about the need to lose weight,      given his body mass index is 35.  4. Followup:  I will see the patient back at the time of the treadmill      testing and then as needed thereafter.     Rollene Rotunda, MD, [facility]  Electronically Signed    JH/MedQ  DD: 04/24/2007  DT: 04/26/2007  Job #: [id number]

## 2011-01-15 NOTE — H&P (Signed)
[facility] ADMISSION   [name] [name], [name] [name]                      MRN:          [id number]  DATE:05/13/2007                            DOB:          09-Jun-1971    The patient to be admitted on September 12 for an outpatient cardiac  catheterization by Dr. Excell Seltzer.   CHIEF COMPLAINT:  Chest pain.   HISTORY OF PRESENT ILLNESS:  This is a 40 year old African-American male  who went to the emergency room in mid-July with chest discomfort.  Apparently, he had no evidence of ischemia and was sent home with  Prilosec and sublingual nitroglycerin.  He saw Dr. Antoine Poche on April 24, 2007 and says he gets it every other day or so, and it lasts 5 to 20  minutes.  He could not qualify it but does not think it is sharp.  He  thinks it is more like a gripping pain.  It is not associated with  nausea, vomiting or diaphoresis, and it is substernal but does radiate  to his neck or his arms.  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  It eases on it's own.   The patient presented today for a treadmill test.  Baseline EKG normal  sinus rhythm with nonspecific ST changes.  The patient only exercised 6  minutes with a Bruce protocol, obtaining a heart rate of 113, and test  was stopped due to shortness of breath and fatigue.  He had a  hypertensive response to exercise, blood pressure before exercise  132/90, jumped to 191/82, and he had associated 2-mm ST depression,  inferior and lateral leads, and couplet PVCs, ST changes resolved.  Post-  exercise, he did continue to have some deeper T-wave inversion in lead  3.  He denied any chest pain.  After discussing the results with Dr.  Antoine Poche, he recommended cardiac catheterization, for definitive  diagnoses of his chest pain and abnormal stress test.  Cardiac risk  factors include tobacco abuse and obesity.   ALLERGIES:  NO KNOWN DRUG  ALLERGIES.   CURRENT MEDICATIONS:  None.   PAST MEDICAL HISTORY:  He has no history of hypertension, diabetes,  thyroid disease, peptic ulcer disease, hyperlipidemia, nor surgeries.   SOCIAL HISTORY:  He smokes a pack of cigarettes a day for the past 10  years.  He also smokes cigars, and he also smokes marijuana but denies  other recreational drugs.  He does drink alcohol.   FAMILY HISTORY:  Negative for coronary artery disease.   REVIEW OF SYSTEMS:  He had a recent chest cold and feels like his  breathing is a little short, secondary to this.  CARDIOPULMONARY:  Please see HPI.  He has no dizziness or pre-syncope, orthopnea,  paroxysmal nocturnal dyspnea, change in bowels or melena.   PHYSICAL EXAMINATION:  GENERAL:  This is an anxious 40 year old African-  American male in no acute distress.  VITAL SIGNS:  Blood pressure 132/90 at rest, 191/82 with stress, pulse  65.  HEENT:  Head is normocephalic without sign of trauma.  Extraocular  movements are intact.  Pupils are equal and reactive to light and  accommodation.  Nose:  Mucosa is moist.  Throat is without erythema or  exudate.  NECK:  Without JVD, HJR or bruits or thyroid enlargement.  LUNGS:  Clear anterior, posterior and lateral.  HEART:  Regular rate and rhythm, 65 beats per minutes, normal S1 and S2.  No murmur or rub, bruit, thrill or heave node.  ABDOMEN:  Obese, normal.  Active bowel sounds are heard throughout.  No  organomegaly, masses, lesions or abnormal tenderness noted.  EXTREMITIES:  Without cyanosis or edema.  He has good distal pulses.  NEUROLOGIC:  Exam is without focal deficits.   IMPRESSION:  1. Chest discomfort, somewhat atypical, but 2-mm ST depression on      stress tests.  2. Tobacco abuse.  3. Obesity.   PLAN:  At this time, cardiac catheterization has been recommended.  The  patient was a bit reluctant at first, now is agreeable.  Risks and  benefits of the test and not having the test has been  explained to him,  and he is scheduled to have it done in the main lab with Dr. Excell Seltzer,  because he is Medicaid.      Jacolyn Reedy, PA-C  Electronically Signed      [name] Rotunda, MD, [facility]  Electronically Signed   ML/MedQ  DD: 05/13/2007  DT: 05/13/2007  Job #: [id number]

## 2011-01-15 NOTE — Discharge Summary (Signed)
[name] [name], [name] [name]               ACCOUNT NO.:  [id number]   MEDICAL RECORD NO.:  [id number]          PATIENT TYPE:  OIB   LOCATION:  6533                         FACILITY:  [facility]   PHYSICIAN:  Madolyn Frieze. Jens Som, MD, FACCDATE OF BIRTH:  April 22, 1971   DATE OF ADMISSION:  05/15/2007  DATE OF DISCHARGE:  05/16/2007                               DISCHARGE SUMMARY   The patient does not have a primary care physician.   DISCHARGE DIAGNOSES:  1. Unstable angina with positive stress test.  2. Coronary artery disease with bare metal stenting to the circumflex      as described.  3. Tobacco and marijuana usage.  4. Hypertension.   PROCEDURES PERFORMED:  Cardiac catheterization with bare metal stenting  to the mid circumflex by Dr. Excell Seltzer on May 15, 2007.   SUMMARY OF HISTORY:  [name] [name] is a 40 year old African American  male who initially presented to the emergency room in mid July with  chest discomfort.  He did not have any evidence of ischemia and he was  sent home with Prilosec and nitroglycerin and arrangements for follow-up  to see Dr. Antoine Poche.  He was evaluated with Dr. Antoine Poche and a stress  test was arranged.  The patient was only able to exercise 6 minutes with  a Bruce protocol.  It was discontinued secondary shortness of breath and  fatigue as well as hypertensive response.  He did have inferior lateral  ST-segment depression.  Arrangements for cardiac catheterization were  made.   PAST MEDICAL HISTORY:  Notable for tobacco and marijuana usage.Marland Kitchen   LABORATORY:  Prior to discharge H&H of 14.3 and 41.9, normal indices,  platelets 202, WBC 10.3.  Sodium 139, potassium 3.7, BUN 10, creatinine  0.88, glucose 89.  CK, MB postprocedure was 254 and 2.3, troponin 0.03.  EKGs while in the hospital showed sinus bradycardia, normal axis,  nonspecific ST-T wave changes; however, it was noted on admission he did  have significant T-wave inversion in leads III and aVF.   HOSPITAL COURSE:  [name] [name] was brought in for cardiac  catheterization.  This was performed by Dr. Excell Seltzer on May 15, 2007.  He had nonobstructive LAD disease and a 95% mid circumflex.  Using a Liberte stent, Dr. Excell Seltzer reduced this from 95 to 0%.  He was  noted to have normal LV function.  He recommended lifelong aspirin and  Plavix for 30 days.  Dr. Excell Seltzer just had a long discussion with the  patient about cardiac risk reduction.  He felt that he would not comply  with a complex regimen.  He also began him on a statin.  By the 13th he  was ambulating without difficulty.  However, on exam [name] noted  a right groin bruit.  Ultrasound was performed, did not show any  evidence of pseudoaneurysm or AV fistula.  Thus it was felt that he be  discharged home.   DISPOSITION:  The patient is discharged home, asked to maintain a heart-  healthy diet.  He received new prescriptions for:   1. Plavix 75 mg daily for 1  month.  2. Zocor 40 mg p.o. q.h.s.  3. Sublingual nitroglycerin 0.4 p.r.n.   He was also asked to take aspirin 325 mg p.o. daily.   Activities and wound care are per supplemental sheet.  He was asked to  call our office to arrange follow-up with Dr. Antoine Poche.  He was advised  to obtain a primary care physician.  He will need blood work in 6-8  weeks in regards to the LFTs and FLP.  He was advised no smoking,  tobacco products or marijuana use, and to bring all medications to all  appointments.   DISCHARGE TIME:  45 minutes.      [name], PA-C      Madolyn Frieze Jens Som, MD, [facility]  Electronically Signed    EW/MEDQ  D:  05/16/2007  T:  05/16/2007  Job:  [id number]   cc:   Rollene Rotunda, MD, [facility]

## 2011-01-15 NOTE — Assessment & Plan Note (Signed)
[name]                            CARDIOLOGY OFFICE NOTE   [name]:[name], [name] KRUCKENBERG                      MRN:          [id number]  DATE:07/08/2008                            DOB:          [birth year]    PRIMARY CARE PHYSICIAN:  [facility].   REASON FOR PRESENTATION:  Evaluate the patient with coronary artery  disease status post stenting.   HISTORY OF PRESENT ILLNESS:  The patient is 40 years old.  He presents  for followup.  This is his sixth month.  He has done well since I last  saw him.  He has actually lost 42 pounds!  He has done this with diet  and exercise.  He is not having any chest discomfort, neck or arm  discomfort.  He is not having any palpitation, presyncope, or syncope.  He is not having any PND or orthopnea.  We stopped his Plavix last time,  though he has been taking it periodically to finish the prescription.  He is only taking his Crestor once in a while.  He is still smoking a  few cigarettes and using marijuana.   PAST MEDICAL HISTORY:  Coronary artery disease (status post abnormal  stress perfusion study.  He had a subsequent cardiac cath demonstrating  95% circumflex stenosis treated with bare-metal stenting, 30% obtuse  marginal stenosis.  His EF was 60% in September 2008), previous  cigarette use, ongoing marijuana use.   ALLERGIES:  None.   MEDICATIONS:  1. Crestor 5 mg daily.  2. Aspirin 325 mg daily.   REVIEW OF SYSTEMS:  As stated in the HPI and otherwise negative for  other systems.   PHYSICAL EXAMINATION:  GENERAL:  The patient is in no distress.  VITAL SIGNS:  Blood pressure 116/74, heart rate 76 and regular.  HEENT:  Eyelids unremarkable; pupils equal, round, and reactive to  light; fundi not visualized; oral mucosa unremarkable.  NECK:  No jugular venous distention 45 degrees; carotid upstroke brisk  and symmetric; no bruits, no thyromegaly.  LYMPHATICS:  No adenopathy.  LUNGS:  Clear to auscultation  bilaterally.  HEART:  PMI not displaced or sustained; S1 and S2 within normal limits;  no S3, no S4; no clicks, no rubs, no murmurs.  ABDOMEN:  Obese; positive bowel sounds, normal in frequency and pitch;  no bruits, no rebound, no guarding; no midline pulsatile mass; no  organomegaly.  SKIN:  No rashes; no nodules.  EXTREMITIES:  2+ pulses; no edema.   EKG sinus rhythm, rate 70, axis within normal limits, intervals within  normal limits, no acute ST-T wave changes.   ASSESSMENT AND PLAN:  1. Coronary artery disease.  The patient is having no new symptoms.      He is participating somewhat in secondary risk reduction.  I      applaud this and encourage more of the same.  2. Tobacco.  He is to quit all cigarettes.  3. Obesity.  I am very proud of him for losing weight and encouraged      about 10 more pounds of weight loss and continued exercise.  4. Dyslipidemia.  He is encouraged to continue taking his Crestor      daily, which he has not been doing.  He can have his lipids      followed at [facility].  The goal is an LDL less than 100 and HDL      greater than 40.  5. Followup.  I will see him back in 1 year or sooner if needed.     Rollene Rotunda, MD, [facility]  Electronically Signed    JH/MedQ  DD: 07/08/2008  DT: 07/09/2008  Job #: [id number]   cc:   Dala Dock

## 2011-01-15 NOTE — Assessment & Plan Note (Signed)
[facility]                            CARDIOLOGY OFFICE NOTE   NAME:[name] [name], [name] [name]                      MRN:          [id number]  DATE:06/11/2007                            DOB:          November 16, 1970    PRIMARY CARE PHYSICIAN:  None.   REASON FOR PRESENTATION:  Evaluate patient with coronary disease status  post stenting.   HISTORY OF PRESENT ILLNESS:  The patient presents for follow up of the  above. He had chest pain and was sent for a stress perfusion study that  demonstrated ST depression in the inferior and lateral leads. He  subsequently had a cardiac catheterization demonstrating 95% circumflex  lesion, 30% obtuse marginal stenosis. He had a well preserved ejection  fraction of 60%. He had this treated with a bare metal stent per Dr.  Excell Seltzer.   The patient has done well since then. He has had resolution of the chest  discomfort that prompted the stress test. He has had no neck or arm  discomfort. He does not have any palpitations, pre-syncope, or syncope.  He is having no PND or orthopnea. He stopped smoking cigars. He still  smokes marijuana. He has tried to change what he is eating. He is not  exercising.   PAST MEDICAL HISTORY:  Previous cigarette and cigar smoking, marijuana  use.   ALLERGIES:  None.   CURRENT MEDICATIONS:  1. Plavix 75 mg daily.  2. Simvastatin 40 mg daily.  3. Aspirin 325 mg daily.   REVIEW OF SYSTEMS:  As stated in the HPI, and otherwise negative for  other systems.   PHYSICAL EXAMINATION:  GENERAL:  The patient is in no acute distress.  VITAL SIGNS:  Blood pressure 118/76, heart rate 71 and regular, weight  221 pounds, body mass index 35.  NECK:  No jugular vein distension at 45 degrees, carotid upstroke brisk  and symmetric, no bruits, no thyromegaly.  LYMPHATICS:  No cervical, axillary, or inguinal adenopathy.  LUNGS:  Clear to auscultation bilaterally.  BACK:  No costovertebral angle tenderness.  CHEST:  Unremarkable.  HEART:  PMI not displaced or sustained, S1 and S2 within normal limits,  no S3, no S4, no clicks, rubs, or murmurs.  ABDOMEN:  Obese, positive bowel sounds, normal to frequency and pitch,  no bruits, no rebound, no guarding, no midline pulsatile mass, no  hepatomegaly, no splenomegaly.  SKIN:  No rashes, no nodules.  EXTREMITIES:  2+ pulses, no edema.   EKG sinus rhythm, rate 65, axis within normal limits, intervals within  normal limits, no acute ST-T wave changes.   ASSESSMENT AND PLAN:  1. Coronary disease. The patient is having no ongoing symptoms      consistent with his previous angina. He is status post stenting and      will continue the Plavix at least 30 days. Current recommendation      suggests optimal would be 12 months, but he may have trouble      continuing with this. He will continue with risk reduction. We had      a long discussion about this.  2. Exercise. I suggested a walking regimen 5 days per week and      hopefully he can comply with this. He should get a lipid profile in      about 6 weeks. He will continue on the statin and we will change      his diet.  3. Follow up. I will see him back in about 3 months or sooner if      needed.     Rollene Rotunda, MD, [facility]  Electronically Signed    JH/MedQ  DD: 06/11/2007  DT: 06/12/2007  Job #: [id number]

## 2011-01-18 NOTE — Op Note (Signed)
NAMEALY, HAUSER               ACCOUNT NO.:  [id number]   MEDICAL RECORD NO.:  [id number]          PATIENT TYPE:  INP   LOCATION:  5706                         FACILITY:  [facility]   PHYSICIAN:  Sigmund I. Patsi Sears, M.D.DATE OF BIRTH:  June 28, 1971   DATE OF PROCEDURE:  07/25/2006  DATE OF DISCHARGE:                               OPERATIVE REPORT   PREOPERATIVE DIAGNOSIS:  Infarcted, infected left testicle.   POSTOPERATIVE DIAGNOSIS:  Infarcted, infected left testicle.   OPERATION:  Left inguinal (radical) orchiectomy.   SURGEON:  Sigmund I. Patsi Sears, M.D.   ANESTHESIA:  General LMA.   HISTORY:  This 40 year old male was seen at the [facility] Emergency  Room 1 week ago with severe left testicular pain and swelling.  He was  felt to have epididymitis, placed on a combination of doxycycline and  Cipro.  He was seen by Dr. Isabel Caprice on Tuesday in followup, with no  resolution of his pain or his swelling.  He was left on antibiotics, but  returned to the emergency room today, where he saw Dr. Milus Glazier.  Urology consultation was obtained, and ultrasound was accomplished,  which showed no blood flow to the testicle, with massive testicular  enlargement and severe pain.  It was felt that the patient should go  ahead and have orchiectomy.  We will approach from the inguinal canal,  in case the patient has tumor, but it would appear that the patient has  an infected testicle with severe orchitis, and a testicular infarction.   DESCRIPTION OF OPERATION:  After appropriate preanesthesia, the patient  was brought to the operating room and placed on the operating room table  in the dorsal supine position, where general LMA anesthesia was  introduced.  He remained in this position, where the pubis was prepped  and the scrotum was prepped with Betadine solution and draped in the  usual fashion.   With the patient in the supine position, with anesthesia, the inguinal  canal was prepped  and draped in the usual fashion.   A 5-cm left inguinal incision was made.  Subcutaneous tissue was  dissected with the electrosurgical unit.  The spermatic cord was  identified and it was noted to be very enlarged and edematous.  A  Penrose drain was placed at the level of the pubic tubercle, and the  spermatic cord was amputated with multiple clamps with 0 Vicryl suture.  Following this, the testicle was easily dissected from the scrotum,  although it was stuck to the entire scrotum and it felt hot and probably  infected.  The testicle was sent to the laboratory for frozen section  analysis to be sure that this was abscess, and not tumor.   The scrotum was irrigated with 500 cc of normal saline, and following  this, a double Penrose drain was placed and sutured in place with 3-0  Vicryl suture in the most dependent portion of the scrotum.  The Penrose  drain was brought up into the inguinal canal.   The wound was closed in 2 layers with 3-0 Vicryl interrupted sutures,  and the skin was  closed with very loosely closed skin with 3-0 Vicryl  sutures.  The sutures will be removed in the office, assuming the  patient does not become more infected.  He will be left on Cipro and was  given IV Cipro in the operating room.      Sigmund I. Patsi Sears, M.D.  Electronically Signed     SIT/MEDQ  D:  07/25/2006  T:  07/25/2006  Job:  [id number]

## 2011-01-18 NOTE — H&P (Signed)
[name] [name], [name] [name]               ACCOUNT NO.:  [id number]   MEDICAL RECORD NO.:  [id number]          PATIENT TYPE:  INP   LOCATION:  5706                         FACILITY:  [facility]   PHYSICIAN:  Sigmund I. Patsi Sears, M.D.DATE OF BIRTH:  01/09/1971   DATE OF ADMISSION:  07/25/2006  DATE OF DISCHARGE:                              HISTORY & PHYSICAL   SUBJECTIVE:  [name] [name] is a 40 year old single African-American  male, seen at [facility] emergency room 1 week ago with fever, and left  testicular pain and felt to have epididymitis.  He was placed on  doxycycline and Cipro, and followed up with Dr. Demetrius Charity on Tuesday.  The  patient did not get his antibiotics filled until Monday, and Dr. Demetrius Charity  saw the patient on Tuesday and felt that the antibiotics had not had  time to work.  The patient has continued to have pain all week, and  comes to the emergency room again today with swollen testicle, fever of  102, and severe left testicular pain.  Urology consult by phone was  obtained, and advised emergency room to obtain a testicular ultrasound.  This was accomplished, and showed infarcted testicle.  The patient had  further laboratories showing a urinalysis which showed no white cells  and no red cells.  His peripheral white blood cell count is 25,000,  hemoglobin 13.9, hematocrit 41.  The patient is admitted by the  operating room for a left orchiectomy.  Permit is obtained, and patient  will be evaluated intra operatively with frozen section for possible  tumor.   PAST MEDICAL HISTORY:  Noncontributory.   ALLERGIES:  None known.   MEDICATIONS:  None.   SOCIAL HISTORY:  The patient is not working.  He lives with the mother  of his child.   ROS:  voids well, no dysuria, without fever, or chills. All systems  negative.   FAMILY HISTORY:  1. Diabetes.  2. Hypertension.   Tobacco was positive.  THC is positive.  Alcohol is positive (heavy).   PAST SURGICAL HISTORY:   Significant for herniorrhaphy.   PHYSICAL EXAMINATION:  Shows a well developed, well nourished African-  American man, no acute distress.  Blood pressure is 140/83, pulse is 97,  respiratory rate 18, temperature is 100.4.  NECK:  Supple and non tender.  CHEST:  Clear to P and A.  ABDOMEN:  Soft.  Positive bowel sounds without organomegaly, without  masses.  GENITOURINARY:  Shows markedly enlarged, hot, erythematous left  hemiscrotum and testicle, very tender to palpation.  Right testicle  measures 4 x 4 cm and non tender.  RECTAL EXAMINATION:  Not indicated.  LYMPHATICS:  Are not palpable.  SKIN:  Is normal to inspection and palpation.  PSYCHOLOGIC:  Shows normal orientation to time, person, and place.   IMPRESSION:  Infarcted, infected left testicle.   PLAN:  Will arrange inguinal orchiectomy today.      Sigmund I. Patsi Sears, M.D.  Electronically Signed     SIT/MEDQ  D:  07/25/2006  T:  07/25/2006  Job:  [id number]   cc:   Elvina Sidle, M.D.

## 2011-03-12 ENCOUNTER — Encounter: Payer: Self-pay | Admitting: Cardiology

## 2011-06-14 LAB — BASIC METABOLIC PANEL
BUN: 10
CO2: 26
Calcium: 8.8
Chloride: 105
Creatinine, Ser: 0.88
GFR calc Af Amer: >60
GFR calc non Af Amer: >60
Glucose, Bld: 89
Potassium: 3.7
Sodium: 139

## 2011-06-14 LAB — CARDIAC PANEL(CRET KIN+CKTOT+MB+TROPI)
CK, MB: 2.3
Relative Index: 0.9
Total CK: 254 — ABNORMAL HIGH
Troponin I: 0.03

## 2011-06-14 LAB — CBC
HCT: 41.9
Hemoglobin: 14.3
MCHC: 34.1
MCV: 91.3
Platelets: 202
RBC: 4.59
RDW: 13.6
WBC: 10.3

## 2011-06-18 LAB — POCT CARDIAC MARKERS
CKMB, poc: <1 — ABNORMAL LOW
Myoglobin, poc: 51.5
Operator id: 151321
Troponin i, poc: <0.05

## 2011-06-18 LAB — I-STAT 8, (EC8 V) (CONVERTED LAB)
BUN: 15
Bicarbonate: 24.8 — ABNORMAL HIGH
Chloride: 109
Glucose, Bld: 102 — ABNORMAL HIGH
HCT: 45
Hemoglobin: 15.3
Operator id: 151321
Potassium: 3.6
Sodium: 143
TCO2: 26
pCO2, Ven: 42.2 — ABNORMAL LOW
pH, Ven: 7.378 — ABNORMAL HIGH

## 2011-06-18 LAB — POCT I-STAT CREATININE
Creatinine, Ser: 1
Operator id: 151321

## 2011-12-26 ENCOUNTER — Encounter (HOSPITAL_COMMUNITY): Payer: Self-pay | Admitting: Emergency Medicine

## 2011-12-26 ENCOUNTER — Emergency Department (INDEPENDENT_AMBULATORY_CARE_PROVIDER_SITE_OTHER)
Admission: EM | Admit: 2011-12-26 | Discharge: 2011-12-26 | Disposition: A | Discharge status: Home / Self Care | Payer: Self-pay | Source: Home / Self Care | Attending: Emergency Medicine | Admitting: Emergency Medicine

## 2011-12-26 DIAGNOSIS — K1120 Sialoadenitis, unspecified: Secondary | ICD-10-CM

## 2011-12-26 MED ORDER — CEPHALEXIN 500 MG PO CAPS: 500.0000 mg | ORAL_CAPSULE | Freq: Four times a day (QID) | ORAL | Status: AC

## 2011-12-26 MED ORDER — IBUPROFEN 800 MG PO TABS: 800.0000 mg | ORAL_TABLET | Freq: Three times a day (TID) | ORAL | Status: AC | PRN

## 2011-12-26 MED ORDER — CHLORHEXIDINE GLUCONATE 0.12 % MT SOLN: 15.0000 mL | Freq: Two times a day (BID) | OROMUCOSAL | Status: AC

## 2011-12-26 NOTE — Discharge Instructions (Signed)
Parotitis  Parotitis is an inflammation of one or both parotid glands. This is the main salivary gland. It lies behind the angle of the jaw and below the ear lobe. The saliva produced comes out of a tiny opening (duct) inside the cheek on either side. It is usually at the level of the upper back teeth. If the parotid gland is swollen, the ear is pushed up and out in a particular way. This helps set this condition apart from a simple lymph gland infection in the same area.  CAUSES   Cases of mumps have mostly disappeared since the start of immunization against mumps. Currently, the most common causes of parotitis are:   Germ (bacterial) infection.   Inflammation of the lymph channels (lymphatics).  Other Uncommon Causes of Parotitis:   Sjogren's syndrome. A condition in which arthritis is associated with a decrease in activity of the glands of the body that produce saliva and tears. Some people are bothered by a dry mouth and intermittent salivary gland enlargement. The diagnosis is made with blood tests or by examination of a piece of tissue from the inside of the lip.   Atypical mycobacteria. Can give rise to a condition that usually infects children. It is a germ similar to tuberculosis. It is often resistant to antibiotic treatment. It may require surgical treatment and removal of the infected salivary gland.   Actinomycosis. An infection of the parotid gland that may also involve the overlying skin. The diagnosis is made by detecting granules of sulphur, produced by the bacteria, on microscopic examination. Treatment is with a prolonged course of penicillin for up to one year.  Acute (Sudden Onset) Bacertial Parotitis  This is a sudden inflammatory response to bacterial infection that causes:   Redness (erythema).   Pain.   Swelling.   Tenderness over the gland on the side of the cheek.   The appearance of pus from the opening of the duct on the inside of the cheek.  It used to be common in dehydrated  and debilitated patients, and often following surgery. It is now more commonly seen after radiotherapy (X-ray treatment) or in patients with a poorly working immune system. Treatment includes:    Correction of the lack of fluids (rehydration).   Medications which kill germs(antibiotics).   Pain relief.  Chronic Recurrent Parotitis  This refers to repeated episodes of discomfort and swelling of the parotid gland. This occurs often after eating. It is caused by decreased flow of saliva. This is often due to either blockage of the duct by a stone or the formation of a duct narrowing. It is treated with:    Gland massage.   Methods to stimulate the flow of saliva (for example, giving lemon juice).   Antibiotics if required.  Surgery to remove the gland is possible. The benefits of surgery need to be balanced against the risk of damage to the facial nerve. The facial nerve allows the muscles of facial expression to function. Damage to this can cause paralysis of one side of the face. X-ray treatment (radiotherapy) and treatment with steroid tablets have been considered. But they are thought to be ineffective.  Viral Parotitis  The most common viral cause of parotitis is mumps. It usually affects 4 to 10 year olds. It causes painful swelling of both parotid glands.  Recurrent Parotitis in Children  This condition is thought to be due to swelling or ballooning of the ducts (ectasia). It results in the same problems(symptoms ) as acute   bacterial parotitis. It is usually caused by bacteria called streptococci that is treated with penicillin. It usually gets well by itself without treatment (self-limiting). Surgery is usually not needed.  Tuberculosis  The parotid glands may become infected with the same bacteria causing tuberculosis (TB). Treatment is with anti-tuberculous antibiotic therapy.  HOME CARE INSTRUCTIONS    Apply ice bags about every 2 hours, for 15 to 20 minutes, while awake, to the sore gland. Place ice  in a plastic bag with a towel around it to prevent frostbite to skin. Continue for 24 hours and then as directed by your caregiver.   Only take over-the-counter or prescription medicines for pain, discomfort, or fever as directed by your caregiver.  SEEK IMMEDIATE MEDICAL CARE IF:    There is increased pain or swelling in your gland that is not controlled with medication.   You have a fever.  Document Released: 02/08/2002 Document Revised: 08/08/2011 Document Reviewed: 07/15/2011  [employer] Patient Information 2012 [employer].

## 2011-12-26 NOTE — ED Provider Notes (Signed)
History     CSN: [id number]  Arrival date & time 12/26/11  1352   First MD Initiated Contact with Patient 12/26/11 1355      Chief Complaint  Patient presents with  . Facial Swelling    (Consider location/radiation/quality/duration/timing/severity/associated sxs/prior treatment) HPI Pt presents with 3 days of swelling and pain of the left side of the face.  He states that after the swelling developed, he also developed tooth pain of the upper and lower molars.   He has been eating and drinking but cannot chew on that side of his mouth.  He denies fevers or chills.  He has been taking tylenol and motrin with some help.  He denies sore throat, ear pain.  His heaing is fine and it does not hurt to swallow.  The pain has been the same since the beginning and is not getting worse.  He describes the pain as an ache.  The pain and swelling are not related to eating. Past Medical History  Diagnosis Date  . Benign essential HTN   . CAD (coronary artery disease)   . Unstable angina     Past Surgical History  Procedure Date  . Cardiac catheterization 05/15/07    with stenting  . Cardiac stents     History reviewed. No pertinent family history.  History  Substance Use Topics  . Smoking status: Smoker, Current Status Unknown  . Smokeless tobacco: Not on file  . Alcohol Use: No      Review of Systems As above, otherwise negative Allergies  Review of patient's allergies indicates no known allergies.  Home Medications   Current Outpatient Rx  Name Route Sig Dispense Refill  . ACETAMINOPHEN 325 MG PO TABS Oral Take 650 mg by mouth every 6 (six) hours as needed.    . ASPIRIN 325 MG PO TABS Oral Take 325 mg by mouth daily.    . IBUPROFEN 200 MG PO TABS Oral Take 200 mg by mouth every 6 (six) hours as needed.    . CEPHALEXIN 500 MG PO CAPS Oral Take 1 capsule (500 mg total) by mouth 4 (four) times daily. 40 capsule 0  . CHLORHEXIDINE GLUCONATE 0.12 % MT SOLN Mouth/Throat Use as  directed 15 mLs in the mouth or throat 2 (two) times daily. 120 mL 0  . CLOPIDOGREL BISULFATE 75 MG PO TABS Oral Take 75 mg by mouth daily.      . IBUPROFEN 800 MG PO TABS Oral Take 1 tablet (800 mg total) by mouth every 8 (eight) hours as needed for pain. 30 tablet 0  . NITROGLYCERIN 0.4 MG SL SUBL Sublingual Place 0.4 mg under the tongue every 5 (five) minutes as needed.     Marland Kitchen SIMVASTATIN 40 MG PO TABS Oral Take 40 mg by mouth at bedtime.       BP 144/80  Pulse 91  Temp(Src) 98.6 F (37 C) (Oral)  Resp 16  SpO2 97%  Physical Exam GEN-alert, mildly uncomfortable Face-there is a 3 cm area of induration and tenderness 1 cm from his tragus towards his cheek bone. There is no associated redness. Mouth-poor dentition, tenderness upper and lower molars on the buccal side of the gums and tenderness with pressure on the teeth. There are no loose teeth. There is no abscess seen in the gums. With palpation of the external area of induration, pus was expressed into the mouth. Able to open his mouth completely No evidence of stone on palpation of the  Parotid or submandibular  gland. No swelling on the floor of the mouth. Neck-there is no swelling, full range of motion. No lymph nodes were able to be palpated. ED Course  Procedures (including critical care time)  Labs Reviewed - No data to display No results found.   1. Parotiditis       MDM  1. Parotiditis-plan to treat with 10 days of Keflex, NSAIDs, warm compresses. Gave Rx for chlorhexidine rinse. Discussed with patient need to return if worsens or be seen by ENT if this is not resolved within 10 days. Answered many patient questions and patient is agreeable to plan.        Reginold Agent, MD 12/26/11 [phone]

## 2011-12-26 NOTE — ED Notes (Addendum)
Noticed early Sunday morning noticed swelling and aching.  Patient touches in front of left ear and along jaw line as constant pain, intermittently teeth hurt.  No specific tooth ailment.  No known injury.  Denies throat swelling, denies tongue swelling

## 2011-12-26 NOTE — ED Provider Notes (Signed)
I saw and evaluated the patient, exam is consistent with parotitis. Sending patient home with Keflex: Warm Compresses, NSAIDs. He is to follow up with ENT in 10 days if he does not get better.  Domenick Gong, M.D.  Luiz Blare, MD 12/26/11 [phone]

## 2012-11-24 ENCOUNTER — Emergency Department (HOSPITAL_COMMUNITY)
Admission: EM | Admit: 2012-11-24 | Discharge: 2012-11-24 | Disposition: A | Discharge status: Home / Self Care | Payer: Self-pay | Attending: Emergency Medicine | Admitting: Emergency Medicine

## 2012-11-24 ENCOUNTER — Encounter (HOSPITAL_COMMUNITY): Payer: Self-pay | Admitting: Emergency Medicine

## 2012-11-24 DIAGNOSIS — K1379 Other lesions of oral mucosa: Secondary | ICD-10-CM

## 2012-11-24 DIAGNOSIS — I10 Essential (primary) hypertension: Secondary | ICD-10-CM | POA: Insufficient documentation

## 2012-11-24 DIAGNOSIS — I2510 Atherosclerotic heart disease of native coronary artery without angina pectoris: Secondary | ICD-10-CM | POA: Insufficient documentation

## 2012-11-24 DIAGNOSIS — Z7902 Long term (current) use of antithrombotics/antiplatelets: Secondary | ICD-10-CM | POA: Insufficient documentation

## 2012-11-24 DIAGNOSIS — F17200 Nicotine dependence, unspecified, uncomplicated: Secondary | ICD-10-CM | POA: Insufficient documentation

## 2012-11-24 DIAGNOSIS — K1370 Unspecified lesions of oral mucosa: Secondary | ICD-10-CM | POA: Insufficient documentation

## 2012-11-24 DIAGNOSIS — Z9861 Coronary angioplasty status: Secondary | ICD-10-CM | POA: Insufficient documentation

## 2012-11-24 DIAGNOSIS — Z7982 Long term (current) use of aspirin: Secondary | ICD-10-CM | POA: Insufficient documentation

## 2012-11-24 DIAGNOSIS — K029 Dental caries, unspecified: Secondary | ICD-10-CM | POA: Insufficient documentation

## 2012-11-24 DIAGNOSIS — Z8679 Personal history of other diseases of the circulatory system: Secondary | ICD-10-CM | POA: Insufficient documentation

## 2012-11-24 DIAGNOSIS — Z79899 Other long term (current) drug therapy: Secondary | ICD-10-CM | POA: Insufficient documentation

## 2012-11-24 MED ORDER — CHLORHEXIDINE GLUCONATE 0.12 % MT SOLN: 15.0000 mL | Freq: Two times a day (BID) | OROMUCOSAL | Status: DC

## 2012-11-24 NOTE — ED Provider Notes (Signed)
History     CSN: [id number]  Arrival date & time 11/24/12  5621   First MD Initiated Contact with Patient 11/24/12 1005      Chief Complaint  Patient presents with  . Mouth Injury    (Consider location/radiation/quality/duration/timing/severity/associated sxs/prior treatment) HPI Comments: Patient presents with complaint of exposure to a bacterial infection that his wife was recently diagnosed with. He does not know what type of infection this was. He states that it was not gonorrhea or Chlamydia. He does not have any urinary symptoms including dysuria, frequency, hematuria. No fevers. Patient also states that he has a bump on the top of his mouth that is been there for approximately 3 weeks. No known injury. No bleeding. Patient states he had previous gum infection that was treated with mouthwash. Area is not tender or painful. Eating makes it more comfortable. Nothing makes it better. Onset of symptoms gradual. Course is constant.  The history is provided by the patient.    Past Medical History  Diagnosis Date  . Benign essential HTN   . CAD (coronary artery disease)   . Unstable angina     Past Surgical History  Procedure Laterality Date  . Cardiac catheterization  05/15/07    with stenting  . Cardiac stents      No family history on file.  History  Substance Use Topics  . Smoking status: Smoker, Current Status Unknown  . Smokeless tobacco: Not on file  . Alcohol Use: No      Review of Systems  Constitutional: Negative for fever.  HENT: Positive for dental problem. Negative for ear pain, sore throat, facial swelling, trouble swallowing and neck pain.   Eyes: Negative for discharge.  Respiratory: Negative for shortness of breath and stridor.   Gastrointestinal: Negative for rectal pain.  Genitourinary: Negative for dysuria, frequency, discharge, genital sores, penile pain and testicular pain.  Musculoskeletal: Negative for arthralgias.  Skin: Negative for color  change and rash.  Neurological: Negative for headaches.  Hematological: Negative for adenopathy.    Allergies  Review of patient's allergies indicates no known allergies.  Home Medications   Current Outpatient Rx  Name  Route  Sig  Dispense  Refill  . acetaminophen (TYLENOL) 325 MG tablet   Oral   Take 650 mg by mouth every 6 (six) hours as needed.         Marland Kitchen aspirin 325 MG tablet   Oral   Take 325 mg by mouth daily.         . clopidogrel (PLAVIX) 75 MG tablet   Oral   Take 75 mg by mouth daily.           Marland Kitchen ibuprofen (ADVIL,MOTRIN) 200 MG tablet   Oral   Take 200 mg by mouth every 6 (six) hours as needed.         . nitroGLYCERIN (NITROQUICK) 0.4 MG SL tablet   Sublingual   Place 0.4 mg under the tongue every 5 (five) minutes as needed.          . simvastatin (ZOCOR) 40 MG tablet   Oral   Take 40 mg by mouth at bedtime.            BP 149/92  Pulse 90  Temp(Src) 98.7 F (37.1 C) (Oral)  Resp 18  SpO2 100%  Physical Exam  Nursing note and vitals reviewed. Constitutional: He appears well-developed and well-nourished.  HENT:  Head: Normocephalic and atraumatic. No trismus in the jaw.  Right Ear:  Tympanic membrane and ear canal normal.  Left Ear: Tympanic membrane and ear canal normal.  Nose: Nose normal.  Mouth/Throat: Uvula is midline, oropharynx is clear and moist and mucous membranes are normal. Abnormal dentition. Dental caries present. No dental abscesses or edematous. No tonsillar abscesses.  No swelling or erythema noted on exam. Very tiny punctate pustule noted on roof of mouth.   Eyes: Pupils are equal, round, and reactive to light.  Neck: Normal range of motion. Neck supple.  No neck swelling or Lugwig's angina  Genitourinary: Penis normal.  Neurological: He is alert.  Skin: Skin is warm and dry.  Psychiatric: He has a normal mood and affect.    ED Course  Procedures (including critical care time)  Labs Reviewed - No data to  display No results found.   1. Mouth pain     10:18 AM Patient seen and examined. He is calling wife to find out what the infection was.   Vital signs reviewed and are as follows: Filed Vitals:   11/24/12 0945  BP: 149/92  Pulse: 90  Temp: 98.7 F (37.1 C)  Resp: 18   10:44 AM patient states that his wife had "BV". Counseled that he cannot get this infection.  Counseled to return with dysuria or penile discharge.  Patient counseled to use mouthwash as needed.  MDM  Exposure to bacterial infection: Per patient, exposed to BV. He is not at risk of acquiring this infection. Counseled. Return instructions given.  Mouth pain: There is a tiny pustule on roof of mouth. Treated with Peridex. No serious infection suspected.         Renne Crigler, PA-C 11/24/12 1047

## 2012-11-24 NOTE — ED Provider Notes (Signed)
Medical screening examination/treatment/procedure(s) were performed by non-physician practitioner and as supervising physician I was immediately available for consultation/collaboration.  Hazelyn Kallen, MD 11/24/12 1513 

## 2012-11-24 NOTE — ED Notes (Signed)
Pt states that his friend had a bacteria infection and states that he should be checked to ensure he did not have an infection also. Denies any urinary sx or frequency or discharge.

## 2012-11-24 NOTE — Discharge Instructions (Signed)
 Please read and follow all provided instructions.  Your diagnoses today include:  1. Mouth pain     The exam and treatment you received today has been provided on an emergency basis only. This is not a substitute for complete medical or dental care.  Tests performed today include:  Vital signs. See below for your results today.   Medications prescribed:   Peridex  mouthwash  Take any prescribed medications only as directed.  Home care instructions:  Follow any educational materials contained in this packet.  Follow-up instructions: Please follow-up with your dentist for further evaluation of your symptoms. If you do not have a dentist or primary care doctor -- see below for referral information.   Dental Assistance: If unable to pay or uninsured, contact:  [facility] or [facility]. to become qualified for the adult dental clinic.  Patients with Medicaid  [facility]  [phone] [address], [phone]  [address] [phone]  If unable to pay, or uninsured: contact [facility] Department [phone] in [place], [phone] in [facility]) to become qualified for the adult dental clinic  Other Low-Cost Community Dental Services:  [facility]  [address] [place], [place], [place], [address]  [phone], Ext. [phone]  2nd and 4th Thursday of the month at [facility]  [facility]  [address] [place], [place], [place], [address]  [phone]  [facility]  [address] [place], [place], [address]  [phone]  [facility] Department  [phone]  [facility]  [phone]  [facility] Department  [phone]  Return instructions:   Please return to the Emergency Department if you experience worsening symptoms.  Please return if you develop a fever, you develop more swelling in your face or neck, you have trouble breathing or swallowing  food.  Please return if you have any other emergent concerns.  Additional Information:  Your vital signs today were: BP 149/92  Pulse 90  Temp(Src) 98.7 F (37.1 C) (Oral)  Resp 18  SpO2 100% If your blood pressure (BP) was elevated above 135/85 this visit, please have this repeated by your doctor within one month. -------------- RESOURCE GUIDE  Chronic Pain Problems:  [facility]:  [phone]  Patients need to be referred by their primary care doctor  Insufficient Money for Medicine:  [employer]:     call 211  [facility]:  [phone]  No Primary Care Doctor:  To locate a primary care doctor that accepts your insurance or provides certain services:  [facility] :   [phone]  Physician Referral Service:  [phone]  Agencies that provide inexpensive medical care:  [facility]:   [phone]  [facility]:   [phone]  [facility]:   [phone]  Women's Clinic:     [phone]  Planned Parenthood:    [phone]  [facility]:     [phone]  Medicaid-accepting [facility] Providers:  [facility]  [address], [phone], Mon-Fri 9am-7pm, Sat 9am-1pm  [facility]  [address] [place], [address] [place], [phone]  [facility]  [address] [place], [phone]  [facility]  [address] [phone]  Jonathon Neighbors  [address], [phone]  Only accepts [employer] patients after they have their name  applied to their card  Self Pay (  no insurance) in [facility] Care Communities:  Sickle Cell Patients: Dr. Jolan Natal, [facility]  [address] [place], [phone]  [facility]  [address] [place], [phone]  [facility] [place]  [facility] [address], [facility] [address]  [phone], Mon-Fri 9am-7pm, Sat 9am-1pm  [facility]  [address] [place], [phone]  [facility]  [address] [place], [phone]  [facility]  [address] [phone]  Dr. Sherlene Diss  [address], [place], [phone]  [facility]  [address] [phone]  [facility]  [address], [phone]  [address], [phone]  [facility]  [address] [place], [phone], 1st & 3rd Saturday every month, 10am-1pm  Strategies for finding a Primary Care Provider:  1) Find a Doctor and Pay Out of Pocket Although you won't have to find out who is covered by your insurance plan, it is a good idea to ask around and get recommendations. You will then need to call the office and see if the doctor you have chosen will accept you as a new patient and what types of options they offer for patients who are self-pay. Some doctors offer discounts or will set up payment plans for their patients who do not have insurance, but you will need to ask so you aren't surprised when you get to your appointment.  2) Contact Your Local Health Department Not all health departments have doctors that can see patients for sick visits, but many do, so it is worth a call to see if yours does. If you don't know where your local health department is, you can check in your phone book. The CDC also has a tool to help you locate your state's health department, and many state websites also have listings of all of their local health departments.  3) Find a Walk-in Clinic If your illness is not likely to be very severe or complicated, you may want to try a walk in clinic. These are popping up all over the country in pharmacies, drugstores, and shopping centers. They're usually staffed by nurse practitioners or physician assistants that have been trained to treat common illnesses and complaints. They're  usually fairly quick and inexpensive. However, if you have serious medical issues or chronic medical problems, these are probably not your best option  STD Testing:  [facility] [place], [place] [facility]  [address] [place], phone [phone] or [phone]    Monday - Friday, call for an appointment  [facility] Department of [facility], [place] [facility]  [address], [place], phone [phone] or [phone]   Monday - Friday, call for an appointment  Abuse/Neglect:  [facility] Child Abuse Hotline:  380 272 3938  [facility] Child Abuse Hotline: 236 539 4363 (After Hours)  Emergency Shelter:  [facility] [phone]  Maternity Homes:  [facility]: [phone]  [facility]: [phone]  MRSA Hotline:   463-830-3350   [facility]  [address] [place]  [phone]   [place] [facility]  [address] [place], [place]  [phone]   [facility] Dept.  [address], [place]  [phone]   [facility] Health  [phone]   [email or web address]  [facility]  [phone]   [facility] in [place]  [address]  [phone], [facility] Child Abuse Hotline  [phone]  [phone] (After Hours)   Behavioral Health Services  Substance Abuse Resources:  Alcohol and Drug Services:     [phone]  [facility]:   [phone]  [facility]:     [phone]  [facility]:      [phone]  Residential & Outpatient Substance Abuse Program: [phone]  Psychological Services:  [facility]:   [phone]  [facility] Services:    [phone]  [facility] Mental Health  [address] [place]  ACCESS LINE: [phone] or  [phone]  [email or web address]
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
## 2013-04-07 ENCOUNTER — Emergency Department (INDEPENDENT_AMBULATORY_CARE_PROVIDER_SITE_OTHER)
Admission: EM | Admit: 2013-04-07 | Discharge: 2013-04-07 | Disposition: A | Discharge status: Home / Self Care | Payer: Self-pay | Source: Home / Self Care | Attending: Family Medicine | Admitting: Family Medicine

## 2013-04-07 ENCOUNTER — Encounter (HOSPITAL_COMMUNITY): Payer: Self-pay | Admitting: Emergency Medicine

## 2013-04-07 ENCOUNTER — Emergency Department (INDEPENDENT_AMBULATORY_CARE_PROVIDER_SITE_OTHER): Payer: Self-pay

## 2013-04-07 DIAGNOSIS — Z23 Encounter for immunization: Secondary | ICD-10-CM

## 2013-04-07 DIAGNOSIS — S1093XA Contusion of unspecified part of neck, initial encounter: Secondary | ICD-10-CM

## 2013-04-07 DIAGNOSIS — S0003XA Contusion of scalp, initial encounter: Secondary | ICD-10-CM

## 2013-04-07 DIAGNOSIS — S0083XA Contusion of other part of head, initial encounter: Secondary | ICD-10-CM

## 2013-04-07 MED ORDER — TETANUS-DIPHTH-ACELL PERTUSSIS 5-2.5-18.5 LF-MCG/0.5 IM SUSP
INTRAMUSCULAR | Status: AC
  Filled 2013-04-07: qty 0.5

## 2013-04-07 MED ORDER — TETANUS-DIPHTH-ACELL PERTUSSIS 5-2.5-18.5 LF-MCG/0.5 IM SUSP
0.5000 mL | Freq: Once | INTRAMUSCULAR | Status: AC
  Administered 2013-04-07: 0.5 mL via INTRAMUSCULAR

## 2013-04-07 NOTE — Discharge Instructions (Signed)
 Ice, ibuprofenreturn as needed. and soft diet as needed, use ointment on lip twice a day for 5 days.
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
## 2013-04-07 NOTE — ED Notes (Signed)
Chart review.
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
## 2013-11-16 ENCOUNTER — Emergency Department (HOSPITAL_COMMUNITY): Payer: Self-pay

## 2013-11-16 ENCOUNTER — Encounter (HOSPITAL_COMMUNITY): Payer: Self-pay | Admitting: Emergency Medicine

## 2013-11-16 ENCOUNTER — Emergency Department (HOSPITAL_COMMUNITY)
Admission: EM | Admit: 2013-11-16 | Discharge: 2013-11-16 | Disposition: A | Discharge status: Home / Self Care | Payer: Self-pay | Attending: Emergency Medicine | Admitting: Emergency Medicine

## 2013-11-16 DIAGNOSIS — M545 Low back pain, unspecified: Secondary | ICD-10-CM | POA: Insufficient documentation

## 2013-11-16 DIAGNOSIS — Z9889 Other specified postprocedural states: Secondary | ICD-10-CM | POA: Insufficient documentation

## 2013-11-16 DIAGNOSIS — Z7982 Long term (current) use of aspirin: Secondary | ICD-10-CM | POA: Insufficient documentation

## 2013-11-16 DIAGNOSIS — Z9861 Coronary angioplasty status: Secondary | ICD-10-CM | POA: Insufficient documentation

## 2013-11-16 DIAGNOSIS — Z791 Long term (current) use of non-steroidal anti-inflammatories (NSAID): Secondary | ICD-10-CM | POA: Insufficient documentation

## 2013-11-16 DIAGNOSIS — I2510 Atherosclerotic heart disease of native coronary artery without angina pectoris: Secondary | ICD-10-CM | POA: Insufficient documentation

## 2013-11-16 DIAGNOSIS — I10 Essential (primary) hypertension: Secondary | ICD-10-CM | POA: Insufficient documentation

## 2013-11-16 LAB — URINALYSIS, ROUTINE W REFLEX MICROSCOPIC
Bilirubin Urine: NEGATIVE
Glucose, UA: NEGATIVE mg/dL
Hgb urine dipstick: NEGATIVE
Ketones, ur: NEGATIVE mg/dL
Leukocytes, UA: NEGATIVE
Nitrite: NEGATIVE
Protein, ur: NEGATIVE mg/dL
Specific Gravity, Urine: 1.024 (ref 1.005–1.030)
Urobilinogen, UA: 1 mg/dL (ref 0.0–1.0)
pH: 5.5 (ref 5.0–8.0)

## 2013-11-16 MED ORDER — OXYCODONE-ACETAMINOPHEN 5-325 MG PO TABS
2.0000 | ORAL_TABLET | Freq: Once | ORAL | Status: AC
  Administered 2013-11-16: 2 via ORAL
  Filled 2013-11-16: qty 2

## 2013-11-16 MED ORDER — METHOCARBAMOL 500 MG PO TABS: 1000.0000 mg | ORAL_TABLET | Freq: Four times a day (QID) | ORAL | Status: DC | PRN

## 2013-11-16 MED ORDER — HYDROCODONE-ACETAMINOPHEN 5-325 MG PO TABS: ORAL_TABLET | ORAL | Status: DC

## 2013-11-16 MED ORDER — NAPROXEN 250 MG PO TABS: 250.0000 mg | ORAL_TABLET | Freq: Two times a day (BID) | ORAL | Status: DC

## 2013-11-16 NOTE — ED Notes (Signed)
Patient transported to CT 

## 2013-11-16 NOTE — Discharge Instructions (Signed)
°Emergency Department Resource Guide °1) Find a Doctor and Pay Out of Pocket °Although you won't have to find out who is covered by your insurance plan, it is a good idea to ask around and get recommendations. You will then need to call the office and see if the doctor you have chosen will accept you as a new patient and what types of options they offer for patients who are self-pay. Some doctors offer discounts or will set up payment plans for their patients who do not have insurance, but you will need to ask so you aren't surprised when you get to your appointment. ° °2) Contact Your Local Health Department °Not all health departments have doctors that can see patients for sick visits, but many do, so it is worth a call to see if yours does. If you don't know where your local health department is, you can check in your phone book. The CDC also has a tool to help you locate your state's health department, and many state websites also have listings of all of their local health departments. ° °3) Find a Walk-in Clinic °If your illness is not likely to be very severe or complicated, you may want to try a walk in clinic. These are popping up all over the country in pharmacies, drugstores, and shopping centers. They're usually staffed by nurse practitioners or physician assistants that have been trained to treat common illnesses and complaints. They're usually fairly quick and inexpensive. However, if you have serious medical issues or chronic medical problems, these are probably not your best option. ° °No Primary Care Doctor: °- Call [facility] at  832-8000 - they can help you locate a primary care doctor that  accepts your insurance, provides certain services, etc. °- Physician Referral Service- 1-800-533-3463 ° °Chronic Pain Problems: °Organization         Address  Phone   Notes  °[facility]  (336) 297-2271 Patients need to be referred by their primary care doctor.  ° °Medication  Assistance: °Organization         Address  Phone   Notes  °[place] [facility] [address] °[place], [place] [address] (336) 641-8030 --Must be a resident of [place] °-- Must have NO insurance coverage whatsoever (no Medicaid/ Medicare, etc.) °-- The pt. MUST have a primary care doctor that directs their care regularly and follows them in the community °  °[facility]  (866) 331-1348   °[employer]  (888) 892-1162   ° °Agencies that provide inexpensive medical care: °Organization         Address  Phone   Notes  °[facility]  (336) 832-8035   °[facility]    (336) 832-7272   °[facility] [address] °[place], [place] [address] (336) 832-4777   °[facility] [address], °[place] (336) 271-4999   °[facility]    (336) 373-0678   °[facility]    (336) 272-1050   °[facility] ° [address], [place] Phone:  (336) 832-4444, Fax:  (336) 832-4440 Hours of Operation:  9 am - 6 pm, M-F.  Also accepts Medicaid/Medicare and self-pay.  °[facility] ° [address], [place] Phone: (336) 832-3150, Fax: (336) [phone]. Hours of Operation:  8:30 am - 5:30 pm, M-F.  Also accepts Medicaid and self-pay.  °[facility] [place] [address], [place] Phone: (336) 878-6027   °[facility] [address], [place], [place] (336)723-1848, Ext. 123 Mondays & Thursdays: 7-9 AM.  First 15 patients are seen on a first come, first serve basis. °  ° °Medicaid-accepting [place] Providers: ° °Organization         Address  Phone   Notes  °[facility] [address], [place] (336) 641-2100 Also accepts self-pay patients.  °[facility] [address], [place] ° (336) 856-9996   °[facility] [address], [place]  (336) 288-8857   °[facility] [address] [place] [address], [place] (336) 299-7000   °Veita Bland [address], [place]  ° (336) 373-1557 Only accepts [place] Access Medicaid patients after they have their name applied to their card.  ° °Self-Pay (no insurance) in [place]: ° °Organization         Address  Phone   Notes  °Sickle Cell Patients, [facility] [address], [place] (336) 832-1970   °[facility] [address], [place] (336) 832-4400   °[facility] ° [address], [place] (336) 992-4800   °[facility]/Dr. Osei-Bonsu ° [address] [place] [address], [place] or [address], [place] (336) 841-8500 Phone number for both [place] and [place] locations is the same.  °[facility] [address], [place] (336) 299-0000   °[facility] [address] [place] [address], [place] or [address] (336) 852-7530 °(336) 878-2260   °[facility] [address], [place] (336) 350-1642, phone; (336) 294-5005, fax Sees patients 1st and 3rd Saturday of every month.  Must not qualify for public or private insurance (i.e. Medicaid, Medicare, [place] Health Choice, Veterans' Benefits) • Household income should be no more than 200% of the poverty level •The clinic cannot treat you if you are pregnant or think you are pregnant • Sexually transmitted diseases are not treated at the clinic.  ° ° °Dental Care: °Organization         Address  Phone  Notes  °[place] [facility] [address], [place] (336) 641-6152 Accepts children up to age 21 who are enrolled in Medicaid or [place] Health Choice; pregnant women with a Medicaid card; and children who have applied for Medicaid or [place] Health Choice, but were declined, whose parents can pay a reduced fee at time of service.  °[place]  [facility] [place]  [address], [place] (336) 641-7733 Accepts children up to age 21 who are enrolled in Medicaid or [place] Health Choice; pregnant women with a Medicaid card; and children who have applied for Medicaid or [place] Health Choice, but were declined, whose parents can pay a reduced fee at time of service.  °[facility] ° [address], [place] (336) 641-4533 Patients are seen by appointment only. Walk-ins are not accepted. [facility] will see patients 18 years of age and older. °Monday - Tuesday (8am-5pm) °Most Wednesdays (8:30-5pm) °$30 per visit, cash only  °[facility] ° [address], [place] (336) 641-4533 Patients are seen by appointment only. Walk-ins are not accepted. [facility] will see patients 18 years of age and older. °One   Wednesday Evening (Monthly: Volunteer Based).  $30 per visit, cash only  °[facility]  (919) 537-3737 for adults; Children under age 4, call Graduate Pediatric Dentistry at (919) [phone]. Children aged 4-14, please call (919) [phone] to request a pediatric application. ° Dental services are provided in all areas of dental care including fillings, crowns and bridges, complete and partial dentures, implants, gum treatment, root canals, and extractions. Preventive care is also provided. Treatment is provided to both adults and children. °Patients are selected via a lottery and there is often a waiting list. °  °[facility] [address], °[place] ° (336) 763-8833 [email or web address] °  °[facility] [address], [place], [place] (336)723-1848, Ext. 123 Second and Fourth Thursday of each month, opens at 6:30 AM; Clinic ends at 9 AM.  Patients are seen on a first-come first-served basis, and a limited number are seen during each clinic.  ° °[facility] ° [address], [place], [place] (336) 723-7904    Eligibility Requirements °You must have lived in [place], [place], or [place] counties for at least the last three months. °  You cannot be eligible for state or federal sponsored healthcare insurance, including Veterans Administration, Medicaid, or Medicare. °  You generally cannot be eligible for healthcare insurance through your employer.  °  How to apply: °Eligibility screenings are held every Tuesday and Wednesday afternoon from 1:00 pm until 4:00 pm. You do not need an appointment for the interview!  °[facility] [address], [place], [place] 336-631-2330   °[facility]  336-342-8273   °[facility]  336-703-3100   °[facility]  336-570-6415   ° °Behavioral Health Resources in the Community: °Intensive Outpatient Programs °Organization         Address  Phone  Notes  °[facility] [address], [place], [place] 336-878-6098   °[facility] [address], [place], [place] 336-832-9800   °[facility] [address], [place], [place] ° 336-882-2125   °[facility] [address],  °[place], [place] 1-800-853-5163 or 336-641-4981   °Substance Abuse Resources °Organization         Address  Phone  Notes  °[facility]  336-882-2125   °[facility]  336-784-9470   °[facility]  336-285-9073   °[facility]  336-845-3988   °Residential & Outpatient Substance Abuse Program  1-800-659-3381   °Psychological Services °Organization         Address  Phone  Notes  °[facility]  336- 832-9600   °[facility]  336- 378-7881   °[facility] [address], [place] 1-800-853-5163 or 336-641-4981   ° °Mobile Crisis Teams °Organization         Address  Phone  Notes  °[facility], Mobile Crisis Care Unit  1-877-626-1772   °Assertive °[facility] ° [address]  [place], [place] 336-834-9664   °Sharon DeEsch [address] °[place] 336-554-5454   ° °Self-Help/Support Groups °Organization         Address  Phone             Notes  °[facility]  - variety of support groups  336- 373-1402 Call for more information  °Narcotics Anonymous (NA), [facility] [address], °[place]  2 meetings at this location  ° °  Residential Treatment Programs Organization         Address  Phone  Notes  [facility] [address],    [place]  [phone]   [facility]  [address], [place] [phone], [place], [place] [phone]   [facility] [address], [place] [phone] Admissions: 8am-3pm M-F  [facility] [address]    [place], [place] [phone](757)[phone]   [facility] [address], [place], [place] [phone]   [facility] [address]  [place], [place] [phone]   [facility] Intensive Outpatient [address], [place], [place] [phone]   [facility] ([facility] [address]  [place], [place] [phone] or (204) [phone]   [facility] ([facility]) [address] [place], [place] [phone](302)[phone] Accepts Medicaid  [facility] [address]  [place] [phone] Substance Abuse/Addiction Treatment   [facility] Organization         Address  Phone  Notes  [facility]  (805)877-2899(888) (331) 095-5875   Angie FavaJulie Brannon, PhD [address], [address] [place], [place]   ([phone](336) [phone] or [phone](336) ([phone]   [facility]   [address] [place], [place] [phone](336) [phone]   [facility] [address], [place], [place] [phone](336) [phone] Insurance/Medicaid/sponsorship through [facility] [address]                                    [place], [place] [phone]) [phone] Therapy/tele-psych/case    [facility] [address]   [place], [place] [phone]) [phone]    Dr. Lolly MustacheArfeen  [phone]) [phone]   [facility]  [employer] [facility] 1) [address], [place] 2) [address], [place] 3)  [address], [place] [phone](336) [phone](336) [phone]  [phone](336) [phone]   [facility] Child Abuse Hotline 970-560-1473(336) 912-125-1960 or (208) 699-5406(336) [phone] (After Hours)       Take the prescriptions as directed.  Apply moist heat or ice to the area(s) of discomfort, for 15 minutes at a time, several times per day for the next few days.  Do not fall asleep on a heating or ice pack.  Call your regular medical doctor tomorrow morning to schedule a follow up appointment this week.  Return to the Emergency Department immediately if worsening.

## 2013-11-16 NOTE — ED Provider Notes (Signed)
CSN: [id number]     Arrival date & time 11/16/13  1915 History   First MD Initiated Contact with Patient 11/16/13 2149     Chief Complaint  Patient presents with  . Flank Pain     HPI Pt was seen at 2150. Per pt, c/o gradual onset and persistence of constant right sided low back "pain" for the past 4 days. Pain began after "I was laying around a lot on that side." Pain worsens with palpation of the area and body position changes. Denies incont/retention of bowel or bladder, no saddle anesthesia, no focal motor weakness, no tingling/numbness in extremities, no fevers, no injury, no abd pain, no N/V/D, no CP/SOB, no rash, no dysuria/hematuria. The symptoms have been associated with no other complaints.     Past Medical History  Diagnosis Date  . Benign essential HTN   . CAD (coronary artery disease)   . Unstable angina    Past Surgical History  Procedure Laterality Date  . Cardiac catheterization  05/15/07    with stenting  . Cardiac stents      History  Substance Use Topics  . Smoking status: Smoker, Current Status Unknown  . Smokeless tobacco: Not on file  . Alcohol Use: No    Review of Systems ROS: Statement: All systems negative except as marked or noted in the HPI; Constitutional: Negative for fever and chills. ; ; Eyes: Negative for eye pain, redness and discharge. ; ; ENMT: Negative for ear pain, hoarseness, nasal congestion, sinus pressure and sore throat. ; ; Cardiovascular: Negative for chest pain, palpitations, diaphoresis, dyspnea and peripheral edema. ; ; Respiratory: Negative for cough, wheezing and stridor. ; ; Gastrointestinal: Negative for nausea, vomiting, diarrhea, abdominal pain, blood in stool, hematemesis, jaundice and rectal bleeding. . ; ; Genitourinary: Negative for dysuria, flank pain and hematuria. ; ; Genital:  No penile drainage or rash, no testicular pain or swelling, no scrotal rash or swelling.;; Musculoskeletal: +LBP. Negative for neck pain. Negative  for swelling and trauma.; ; Skin: Negative for pruritus, rash, abrasions, blisters, bruising and skin lesion.; ; Neuro: Negative for headache, lightheadedness and neck stiffness. Negative for weakness, altered level of consciousness , altered mental status, extremity weakness, paresthesias, involuntary movement, seizure and syncope.        Allergies  Review of patient's allergies indicates no known allergies.  Home Medications   Current Outpatient Rx  Name  Route  Sig  Dispense  Refill  . aspirin 325 MG tablet   Oral   Take 325 mg by mouth daily.         Marland Kitchen. HYDROcodone-acetaminophen (NORCO/VICODIN) 5-325 MG per tablet      1 or 2 tabs PO q6 hours prn pain   20 tablet   0   . methocarbamol (ROBAXIN) 500 MG tablet   Oral   Take 2 tablets (1,000 mg total) by mouth 4 (four) times daily as needed for muscle spasms (muscle spasm/pain).   25 tablet   0   . naproxen (NAPROSYN) 250 MG tablet   Oral   Take 1 tablet (250 mg total) by mouth 2 (two) times daily with a meal.   14 tablet   0    BP 138/94  Pulse 77  Temp(Src) 99.3 F (37.4 C) (Oral)  Resp 18  Wt 228 lb (103.42 kg)  SpO2 98% Physical Exam 2155: Physical examination:  Nursing notes reviewed; Vital signs and O2 SAT reviewed;  Constitutional: Well developed, Well nourished, Well hydrated, In no acute  distress; Head:  Normocephalic, atraumatic; Eyes: EOMI, PERRL, No scleral icterus; ENMT: Mouth and pharynx normal, Mucous membranes moist; Neck: Supple, Full range of motion, No lymphadenopathy; Cardiovascular: Regular rate and rhythm, No murmur, rub, or gallop; Respiratory: Breath sounds clear & equal bilaterally, No rales, rhonchi, wheezes.  Speaking full sentences with ease, Normal respiratory effort/excursion; Chest: Nontender, Movement normal; Abdomen: Soft, Nontender, Nondistended, Normal bowel sounds; Genitourinary: No CVA tenderness; Spine:  No midline CS, TS, LS tenderness. +TTP right lumbar paraspinal muscles.;;  Extremities: Pulses normal, No tenderness, No edema, No calf edema or asymmetry.; Neuro: AA&Ox3, Major CN grossly intact.  Speech clear. No gross focal motor or sensory deficits in extremities. Strength 5/5 equal bilat UE's and LE's, including great toe dorsiflexion.  DTR 2/4 equal bilat UE's and LE's.  No gross sensory deficits.  Neg straight leg raises bilat. Climbs on and off chair at bedside easily by himself. Gait steady.; Skin: Color normal, Warm, Dry.   ED Course  Procedures     EKG Interpretation None      MDM  MDM Reviewed: previous chart, nursing note and vitals Interpretation: labs and CT scan    Results for orders placed during the hospital encounter of 11/16/13  URINALYSIS, ROUTINE W REFLEX MICROSCOPIC      Result Value Ref Range   Color, Urine YELLOW  YELLOW   APPearance CLEAR  CLEAR   Specific Gravity, Urine 1.024  1.005 - 1.030   pH 5.5  5.0 - 8.0   Glucose, UA NEGATIVE  NEGATIVE mg/dL   Hgb urine dipstick NEGATIVE  NEGATIVE   Bilirubin Urine NEGATIVE  NEGATIVE   Ketones, ur NEGATIVE  NEGATIVE mg/dL   Protein, ur NEGATIVE  NEGATIVE mg/dL   Urobilinogen, UA 1.0  0.0 - 1.0 mg/dL   Nitrite NEGATIVE  NEGATIVE   Leukocytes, UA NEGATIVE  NEGATIVE   Ct Abdomen Pelvis Wo Contrast 11/16/2013   CLINICAL DATA:  Flank pain.  EXAM: CT ABDOMEN AND PELVIS WITHOUT CONTRAST  TECHNIQUE: Multidetector CT imaging of the abdomen and pelvis was performed following the standard protocol without intravenous contrast.  COMPARISON:  None.  FINDINGS: Liver normal. Spleen normal. Pancreas normal. Gallbladder nondistended. No biliary distention.  Adrenals normal. Kidneys normal. No hydronephrosis or hydroureter. No evidence of obstructing ureteral stone. Bladder nondistended. Calcification prostate. No pelvic fluid collections.  Shotty inguinal lymph nodes. Shotty retroperitoneal lymph nodes. Abdominal aorta normal caliber.  Appendix normal. No inflammatory changes in the right or left lower  quadrant. No bowel distention. No free air. No mesenteric mass lesions.  Heart size normal. Lung bases clear. Small inguinal hernias with herniation of fat only. No significant abdominal hernia. No acute bony abnormality.  IMPRESSION: No acute abnormality. Specifically there is no evidence of obstructing ureteral stone.   Electronically Signed   By: Maisie Fus  Register   On: 11/16/2013 22:21    2250:  Workup reassuring. Will tx symptomatically for msk pain at this time. Dx and testing d/w pt and family.  Questions answered.  Verb understanding, agreeable to d/c home with outpt f/u.    Laray Anger, DO 11/19/13 1349

## 2013-11-16 NOTE — ED Notes (Signed)
Per pt report: pt c/o of right flank pain that began about 4 days ago.  Pt denies any injury.  Pt has taken "2 bottles of ibuprofen" with no relief.  Pain increases with movement.  Pt denies any issues upon urination.  Pt a/o x 4.  Skin warm and dry. Pt ambulatory in triage.

## 2013-11-24 ENCOUNTER — Encounter (HOSPITAL_COMMUNITY): Payer: Self-pay | Admitting: Emergency Medicine

## 2013-11-24 ENCOUNTER — Emergency Department (HOSPITAL_COMMUNITY)
Admission: EM | Admit: 2013-11-24 | Discharge: 2013-11-24 | Disposition: A | Discharge status: Home / Self Care | Payer: Self-pay | Attending: Emergency Medicine | Admitting: Emergency Medicine

## 2013-11-24 DIAGNOSIS — IMO0002 Reserved for concepts with insufficient information to code with codable children: Secondary | ICD-10-CM | POA: Insufficient documentation

## 2013-11-24 DIAGNOSIS — Z9889 Other specified postprocedural states: Secondary | ICD-10-CM | POA: Insufficient documentation

## 2013-11-24 DIAGNOSIS — I200 Unstable angina: Secondary | ICD-10-CM | POA: Insufficient documentation

## 2013-11-24 DIAGNOSIS — I2510 Atherosclerotic heart disease of native coronary artery without angina pectoris: Secondary | ICD-10-CM | POA: Insufficient documentation

## 2013-11-24 DIAGNOSIS — Z791 Long term (current) use of non-steroidal anti-inflammatories (NSAID): Secondary | ICD-10-CM | POA: Insufficient documentation

## 2013-11-24 DIAGNOSIS — M545 Low back pain, unspecified: Secondary | ICD-10-CM | POA: Insufficient documentation

## 2013-11-24 DIAGNOSIS — Y929 Unspecified place or not applicable: Secondary | ICD-10-CM | POA: Insufficient documentation

## 2013-11-24 DIAGNOSIS — F17200 Nicotine dependence, unspecified, uncomplicated: Secondary | ICD-10-CM | POA: Insufficient documentation

## 2013-11-24 DIAGNOSIS — I10 Essential (primary) hypertension: Secondary | ICD-10-CM | POA: Insufficient documentation

## 2013-11-24 DIAGNOSIS — X58XXXA Exposure to other specified factors, initial encounter: Secondary | ICD-10-CM | POA: Insufficient documentation

## 2013-11-24 DIAGNOSIS — Y939 Activity, unspecified: Secondary | ICD-10-CM | POA: Insufficient documentation

## 2013-11-24 DIAGNOSIS — Z9861 Coronary angioplasty status: Secondary | ICD-10-CM | POA: Insufficient documentation

## 2013-11-24 DIAGNOSIS — Z7982 Long term (current) use of aspirin: Secondary | ICD-10-CM | POA: Insufficient documentation

## 2013-11-24 DIAGNOSIS — T148XXA Other injury of unspecified body region, initial encounter: Secondary | ICD-10-CM

## 2013-11-24 MED ORDER — CYCLOBENZAPRINE HCL 10 MG PO TABS: 10.0000 mg | ORAL_TABLET | Freq: Two times a day (BID) | ORAL | Status: DC | PRN

## 2013-11-24 MED ORDER — MELOXICAM 7.5 MG PO TABS: 7.5000 mg | ORAL_TABLET | Freq: Every day | ORAL | Status: DC

## 2013-11-24 MED ORDER — IBUPROFEN 800 MG PO TABS
800.0000 mg | ORAL_TABLET | Freq: Once | ORAL | Status: AC
  Administered 2013-11-24: 800 mg via ORAL
  Filled 2013-11-24: qty 1

## 2013-11-24 NOTE — Discharge Instructions (Signed)

## 2013-11-24 NOTE — ED Notes (Signed)
Pt c/o right sided pain x 8 days. Was here for same symptoms last week and given pain meds and muscle relaxants states he is still having pain. Did not get muscle relaxant.

## 2013-11-24 NOTE — ED Notes (Signed)
Pt presents with upper right rib cage pain for over a week. Pt was seen for same last week and given meds.

## 2013-11-24 NOTE — ED Provider Notes (Signed)
Medical screening examination/treatment/procedure(s) were performed by non-physician practitioner and as supervising physician I was immediately available for consultation/collaboration.   EKG Interpretation None        Tymel Conely, MD 11/24/13 1544 

## 2013-11-24 NOTE — ED Provider Notes (Signed)
CSN: [id number]     Arrival date & time 11/24/13  1059 History   First MD Initiated Contact with Patient 11/24/13 1122     Chief Complaint  Patient presents with  . Flank Pain     (Consider location/radiation/quality/duration/timing/severity/associated sxs/prior Treatment) HPI  Patient presents to the ER with complaints of right flank pain for 10 days. He was seen one week ago for this and had a complete work up which included CT scan of the abd/pelv which came back reassuring. He was given an Rx for antiinflammatory and muscle relaxer as well as pain medication. He didn't get the muscle relaxer because he couldn't afford it. He called the the Ortho doc to schedule an appointment as directed but they say he can't be seen for 30 days since he is a new patient. He says the pain has not changed, he ran out of his medications and his pain persists. Worse with movement. No CP, SOB, weight loss, diaphoresis, coughing, dysuria, hematuria  Past Medical History  Diagnosis Date  . Benign essential HTN   . CAD (coronary artery disease)   . Unstable angina    Past Surgical History  Procedure Laterality Date  . Cardiac catheterization  05/15/07    with stenting  . Cardiac stents     No family history on file. History  Substance Use Topics  . Smoking status: Smoker, Current Status Unknown  . Smokeless tobacco: Not on file  . Alcohol Use: No    Review of Systems  ROS: Statement: All systems negative except as marked or noted in the HPI;  Constitutional: Negative for fever and chills. ;  Eyes: Negative for eye pain, redness and discharge. ENMT: Negative for ear pain, hoarseness, nasal congestion, sinus pressure and sore throat.  Cardiovascular: Negative for chest pain, palpitations, diaphoresis, dyspnea and peripheral edema.  Respiratory: Negative for cough, wheezing and stridor. Gastrointestinal: Negative for nausea, vomiting, diarrhea, abdominal pain, blood in stool, hematemesis, jaundice  and rectal bleeding. . Genitourinary: Negative for dysuria, flank pain and hematuria. ; Genital: No penile drainage or rash, no testicular pain or swelling, no scrotal rash or swelling.  Musculoskeletal: Negative for neck pain. Negative for swelling and trauma.;  + flank pain Skin: Negative for pruritus, rash, abrasions, blisters, bruising and skin lesion.  Neuro: Negative for headache, lightheadedness and neck stiffness. Negative for weakness, altered level of consciousness , altered mental status, extremity weakness, paresthesias, involuntary movement, seizure and syncope.     Allergies  Review of patient's allergies indicates no known allergies.  Home Medications   Current Outpatient Rx  Name  Route  Sig  Dispense  Refill  . aspirin 325 MG tablet   Oral   Take 325 mg by mouth daily.         . cyclobenzaprine (FLEXERIL) 10 MG tablet   Oral   Take 1 tablet (10 mg total) by mouth 2 (two) times daily as needed for muscle spasms.   30 tablet   0   . HYDROcodone-acetaminophen (NORCO/VICODIN) 5-325 MG per tablet      1 or 2 tabs PO q6 hours prn pain   20 tablet   0   . meloxicam (MOBIC) 7.5 MG tablet   Oral   Take 1 tablet (7.5 mg total) by mouth daily.   30 tablet   0   . methocarbamol (ROBAXIN) 500 MG tablet   Oral   Take 2 tablets (1,000 mg total) by mouth 4 (four) times daily as needed for muscle  spasms (muscle spasm/pain).   25 tablet   0   . naproxen (NAPROSYN) 250 MG tablet   Oral   Take 1 tablet (250 mg total) by mouth 2 (two) times daily with a meal.   14 tablet   0    BP 151/97  Pulse 81  Temp(Src) 98.7 F (37.1 C) (Oral)  Resp 18  SpO2 100% Physical Exam  Nursing note and vitals reviewed. Constitutional: He appears well-developed and well-nourished. No distress.  HENT:  Head: Normocephalic and atraumatic.  Eyes: Pupils are equal, round, and reactive to light.  Neck: Normal range of motion. Neck supple.  Cardiovascular: Normal rate and regular  rhythm.   Pulmonary/Chest: Effort normal.  Abdominal: Soft.  Musculoskeletal:       Lumbar back: He exhibits decreased range of motion (due to pain), tenderness, pain and spasm. He exhibits no bony tenderness, no swelling, no edema, no laceration and normal pulse.       Back:  Neurological: He is alert.  Skin: Skin is warm and dry.    ED Course  Procedures (including critical care time) Labs Review Labs Reviewed - No data to display Imaging Review No results found.   EKG Interpretation None      MDM   Final diagnoses:  Muscle strain      42 y.o.[name] [name]'s  with back pain. No neurological deficits and normal neuro exam. Patient can walk but states is painful. No loss of bowel or bladder control. No concern for cauda equina. No fever, night sweats, weight loss, h/o cancer, IVDU. RICE protocol and pain medicine indicated and discussed with patient.   Patient Plan 1. Medications:  pain medication, muscle relaxer and usual home medications  2. Treatment: rest, drink plenty of fluids, gentle stretching as discussed, alternate ice and heat  3. Follow Up: Please followup with your primary doctor for discussion of your diagnoses and further evaluation after today's visit; if you do not have a primary care doctor use the resource guide provided to find one   Vital signs are stable at discharge. Filed Vitals:   11/24/13 1115  BP: 151/97  Pulse: 81  Temp: 98.7 F (37.1 C)  Resp: 18    Patient/guardian has voiced understanding and agreed to follow-up with the PCP or specialist.          Dorthula Matasiffany G Orion Mole, PA-C 11/24/13 1147

## 2014-03-28 ENCOUNTER — Encounter (HOSPITAL_COMMUNITY): Payer: Self-pay | Admitting: Emergency Medicine

## 2014-03-28 ENCOUNTER — Emergency Department (HOSPITAL_COMMUNITY)
Admission: EM | Admit: 2014-03-28 | Discharge: 2014-03-28 | Disposition: A | Discharge status: Home / Self Care | Payer: Self-pay | Attending: Emergency Medicine | Admitting: Emergency Medicine

## 2014-03-28 DIAGNOSIS — I2510 Atherosclerotic heart disease of native coronary artery without angina pectoris: Secondary | ICD-10-CM | POA: Insufficient documentation

## 2014-03-28 DIAGNOSIS — Z9889 Other specified postprocedural states: Secondary | ICD-10-CM | POA: Insufficient documentation

## 2014-03-28 DIAGNOSIS — Z791 Long term (current) use of non-steroidal anti-inflammatories (NSAID): Secondary | ICD-10-CM | POA: Insufficient documentation

## 2014-03-28 DIAGNOSIS — F1010 Alcohol abuse, uncomplicated: Secondary | ICD-10-CM | POA: Insufficient documentation

## 2014-03-28 DIAGNOSIS — I10 Essential (primary) hypertension: Secondary | ICD-10-CM | POA: Insufficient documentation

## 2014-03-28 DIAGNOSIS — I200 Unstable angina: Secondary | ICD-10-CM | POA: Insufficient documentation

## 2014-03-28 DIAGNOSIS — F1210 Cannabis abuse, uncomplicated: Secondary | ICD-10-CM | POA: Insufficient documentation

## 2014-03-28 DIAGNOSIS — Z7982 Long term (current) use of aspirin: Secondary | ICD-10-CM | POA: Insufficient documentation

## 2014-03-28 DIAGNOSIS — Z79899 Other long term (current) drug therapy: Secondary | ICD-10-CM | POA: Insufficient documentation

## 2014-03-28 LAB — COMPREHENSIVE METABOLIC PANEL
ALT: 27 U/L (ref 0–53)
AST: 20 U/L (ref 0–37)
Albumin: 4 g/dL (ref 3.5–5.2)
Alkaline Phosphatase: 68 U/L (ref 39–117)
Anion gap: 15 (ref 5–15)
BUN: 20 mg/dL (ref 6–23)
CO2: 22 mEq/L (ref 19–32)
Calcium: 9.1 mg/dL (ref 8.4–10.5)
Chloride: 101 mEq/L (ref 96–112)
Creatinine, Ser: 1.06 mg/dL (ref 0.50–1.35)
GFR calc Af Amer: >90 mL/min (ref >90)
GFR calc non Af Amer: 86 mL/min — ABNORMAL LOW (ref >90)
Glucose, Bld: 90 mg/dL (ref 70–99)
Potassium: 4.2 mEq/L (ref 3.7–5.3)
Sodium: 138 mEq/L (ref 137–147)
Total Bilirubin: 0.4 mg/dL (ref 0.3–1.2)
Total Protein: 8.1 g/dL (ref 6.0–8.3)

## 2014-03-28 LAB — CBC
HCT: 44.9 % (ref 39.0–52.0)
Hemoglobin: 14.9 g/dL (ref 13.0–17.0)
MCH: 30.8 pg (ref 26.0–34.0)
MCHC: 33.2 g/dL (ref 30.0–36.0)
MCV: 92.8 fL (ref 78.0–100.0)
Platelets: 228 10*3/uL (ref 150–400)
RBC: 4.84 MIL/uL (ref 4.22–5.81)
RDW: 13.7 % (ref 11.5–15.5)
WBC: 12 10*3/uL — ABNORMAL HIGH (ref 4.0–10.5)

## 2014-03-28 LAB — RAPID URINE DRUG SCREEN, HOSP PERFORMED
Amphetamines: NOT DETECTED
Barbiturates: NOT DETECTED
Benzodiazepines: NOT DETECTED
Cocaine: NOT DETECTED
Opiates: NOT DETECTED
Tetrahydrocannabinol: POSITIVE — AB

## 2014-03-28 LAB — ACETAMINOPHEN LEVEL: Acetaminophen (Tylenol), Serum: <15 ug/mL (ref 10–30)

## 2014-03-28 LAB — SALICYLATE LEVEL: Salicylate Lvl: 2.1 mg/dL — ABNORMAL LOW (ref 2.8–20.0)

## 2014-03-28 LAB — ETHANOL: Alcohol, Ethyl (B): <11 mg/dL (ref 0–11)

## 2014-03-28 MED ORDER — IBUPROFEN 400 MG PO TABS: 400.0000 mg | ORAL_TABLET | Freq: Four times a day (QID) | ORAL | Status: DC | PRN

## 2014-03-28 MED ORDER — ADULT MULTIVITAMIN W/MINERALS CH
1.0000 | ORAL_TABLET | Freq: Every day | ORAL | Status: DC
  Administered 2014-03-28: 1 via ORAL
  Filled 2014-03-28: qty 1

## 2014-03-28 MED ORDER — LORAZEPAM 2 MG/ML IJ SOLN: 1.0000 mg | Freq: Four times a day (QID) | INTRAMUSCULAR | Status: DC | PRN

## 2014-03-28 MED ORDER — LORAZEPAM 1 MG PO TABS: 1.0000 mg | ORAL_TABLET | Freq: Four times a day (QID) | ORAL | Status: DC | PRN

## 2014-03-28 MED ORDER — ACETAMINOPHEN 500 MG PO TABS: 1000.0000 mg | ORAL_TABLET | ORAL | Status: DC | PRN

## 2014-03-28 MED ORDER — FOLIC ACID 1 MG PO TABS
1.0000 mg | ORAL_TABLET | Freq: Every day | ORAL | Status: DC
  Administered 2014-03-28: 1 mg via ORAL
  Filled 2014-03-28: qty 1

## 2014-03-28 MED ORDER — THIAMINE HCL 100 MG/ML IJ SOLN: 100.0000 mg | Freq: Every day | INTRAMUSCULAR | Status: DC

## 2014-03-28 MED ORDER — VITAMIN B-1 100 MG PO TABS
100.0000 mg | ORAL_TABLET | Freq: Every day | ORAL | Status: DC
  Administered 2014-03-28: 100 mg via ORAL
  Filled 2014-03-28: qty 1

## 2014-03-28 MED ORDER — LORAZEPAM 1 MG PO TABS: 0.0000 mg | ORAL_TABLET | Freq: Four times a day (QID) | ORAL | Status: DC

## 2014-03-28 MED ORDER — LORAZEPAM 1 MG PO TABS: 0.0000 mg | ORAL_TABLET | Freq: Two times a day (BID) | ORAL | Status: DC

## 2014-03-28 NOTE — ED Notes (Signed)
He states he wanted to go to [facility] for detox from alcohol and marijuana. They told him to come here for medical clearance first. Denies any other substance use. Last used last night. He denies physical complaints. Denies si/hi

## 2014-03-28 NOTE — BH Assessment (Signed)
Tele Assessment Note   [name] [name] is an 43 y.o. male. 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 Patient reports that he went to [facility] and was told he needed to come to the emergency department for medical clearance. Patient reports a long history of alcohol and drug abuse. He started drinking alcohol at the age of 52. He started using THC at the age of 72. He reports episodes of alcohol binges throughout his life with moments of sobriety. His last alcohol binge has been on-going for the past 6 months to 1 year. Patient has drank daily 1 pint of liquor and 6-12 beers daily. He reports daily THC use for the past 6 months to 1 year. His last use of both substances was 4 days ago. No withdrawal symptoms reported. No hx of seizures/DT's. He denies SI with no associated hx of previous attempts/gestures. He does admit to depression. Sts that his stressors include unemployment. He has 2 children stating, "I don't have any money to support my family ...my kids". He denies HI. Patient is calm and cooperative. No hx of violence. He has a current legal charge DUI with an upcoming court date Sept 22, 2015. Patient denies AVH's.    Axis I: Alcohol Dependence, Cannibas Abuse, and Depressive Disorder NOS Axis II: Deferred Axis III:  Past Medical History  Diagnosis Date  . Benign essential HTN   . CAD (coronary artery disease)   . Unstable angina    Axis IV: other psychosocial or environmental problems, problems related to social environment, problems with access to health care services and problems with primary support group Axis V: 31-40 impairment in reality testing  Past Medical History:  Past Medical History  Diagnosis Date  . Benign essential HTN   . CAD (coronary artery disease)   . Unstable angina     Past Surgical History  Procedure Laterality Date  . Cardiac catheterization  05/15/07    with stenting  . Cardiac stents      Family History: History  reviewed. No pertinent family history.  Social History:  reports that he has been smoking.  He does not have any smokeless tobacco history on file. He reports that he does not drink alcohol or use illicit drugs.  Additional Social History:  Alcohol / Drug Use Pain Medications: SEE MAR Prescriptions: SEE MAR Over the Counter: SEE MAR History of alcohol / drug use?: Yes Negative Consequences of Use: Legal (obtained a DUI recently) Substance #1 Name of Substance 1: Alcohol 1 - Age of First Use: 43 yrs old  1 - Amount (size/oz): 1 pint and 6 or 12 pack of beer 1 - Frequency: daily  1 - Duration: 6 months to a yr 1 - Last Use / Amount: 4 days ago Substance #2 Name of Substance 2: THC 2 - Age of First Use: 43 yrs old  2 - Amount (size/oz): "As much as I can get"; "At least a dime" 2 - Frequency: daily  2 - Duration: 6 months to 1 yr 2 - Last Use / Amount: 4 days ago  CIWA: CIWA-Ar BP: 131/76 mmHg Pulse Rate: 80 Nausea and Vomiting: no nausea and no vomiting Tactile Disturbances: none Tremor: no tremor Auditory Disturbances: not present Paroxysmal Sweats: no sweat visible Visual Disturbances: not present Anxiety: no anxiety, at ease Headache, Fullness in Head: none present Agitation: normal activity Orientation and Clouding of Sensorium: oriented and can do serial additions CIWA-Ar Total: 0 COWS:  Allergies: No Known Allergies  Home Medications:  (Not in a hospital admission)  OB/GYN Status:  No LMP for male patient.  General Assessment Data [facility] Is this a Tele or Face-to-Face Assessment?: Tele Assessment Is this an Initial Assessment or a Re-assessment for this encounter?: Initial Assessment Living Arrangements: Other (Comment);Non-relatives/Friends (pt lives with a friend) Can pt return to current living arrangement?: No Admission Status: Voluntary Is patient capable of signing voluntary admission?: Yes Transfer from: Acute  Hospital Referral Source: [facility]Family/Friend     [facility] Living Arrangements: Other (Comment);Non-relatives/Friends (pt lives with a friend) Name of Psychiatrist:  (n/a) Name of Therapist:  (n/a)  Education Status Is patient currently in school?: No  Risk to self Suicidal Ideation: No Suicidal Intent: No Is patient at risk for suicide?: No Suicidal Plan?: No Access to Means: No What has been your use of drugs/alcohol within the last 12 months?:  (n/a) Previous Attempts/Gestures: No How many times?:  (n/a) Other Self Harm Risks:  (n/a) Triggers for Past Attempts: Other (Comment) (none reported ) Intentional Self Injurious Behavior: None Family Suicide History: No Recent stressful life event(s): Other (Comment) (recenlty obtained a DUI) Persecutory voices/beliefs?: No Depression: Yes Depression Symptoms: Feeling angry/irritable;Feeling worthless/self pity;Loss of interest in usual pleasures;Guilt;Fatigue;Isolating;Tearfulness;Insomnia;Despondent Substance abuse history and/or treatment for substance abuse?: No Suicide prevention information given to non-admitted patients: Not applicable  Risk to Others Homicidal Ideation: No Thoughts of Harm to Others: No Current Homicidal Intent: No Current Homicidal Plan: No Access to Homicidal Means: No Identified Victim:  (n/a) History of harm to others?: No Assessment of Violence: None Noted Violent Behavior Description:  (patient is calm and cooperative ) Does patient have access to weapons?: No Criminal Charges Pending?: No Does patient have a court date: No  Psychosis Hallucinations: None noted Delusions: None noted  Mental Status Report Appear/Hygiene: Disheveled Eye Contact: Fair Motor Activity: Freedom of movement Speech: Logical/coherent Level of Consciousness: Alert Mood: Depressed Affect: Appropriate to circumstance Anxiety Level: None Thought Processes: Coherent;Relevant Judgement:  Unimpaired Orientation: Person;Place;Time;Situation Obsessive Compulsive Thoughts/Behaviors: None  Cognitive Functioning Concentration: Decreased Memory: Remote Intact;Recent Intact IQ: Average Insight: Good Impulse Control: Good Appetite: Fair Weight Loss:  (none reported ) Weight Gain:  (none reported ) Sleep: No Change Total Hours of Sleep:  (varies ) Vegetative Symptoms: None  ADLScreening [facility]) Patient's cognitive ability adequate to safely complete daily activities?: No Patient able to express need for assistance with ADLs?: Yes Independently performs ADLs?: Yes (appropriate for developmental age)  Prior Inpatient Therapy Prior Inpatient Therapy: Yes Prior Therapy Dates:  (4-5 yrs ago) Prior Therapy Facilty/Provider(s):  ([facility] and [facility] ) Reason for Treatment: detox and residential treatment  Prior Outpatient Therapy Prior Outpatient Therapy: No Prior Therapy Dates:  (n/a) Prior Therapy Facilty/Provider(s):  (n/a) Reason for Treatment:  (n/a)  ADL Screening (condition at time of admission) Patient's cognitive ability adequate to safely complete daily activities?: No Patient able to express need for assistance with ADLs?: Yes Independently performs ADLs?: Yes (appropriate for developmental age)         Values / Beliefs Cultural Requests During Hospitalization: None Spiritual Requests During Hospitalization: None     Nutrition Screen- [facility] Adult/[facility] Patient's home diet: Regular (Pt. ate 100% of lunch)  Additional Information 1:1 In Past 12 Months?: No CIRT Risk: No Elopement Risk: No Does patient have medical clearance?: Yes     Disposition:  Disposition Initial Assessment Completed for this Encounter: Yes Disposition of Patient: Inpatient treatment  program Type of inpatient treatment program: Adult  Octaviano Batty 03/28/2014 6:27 PM

## 2014-03-28 NOTE — ED Provider Notes (Signed)
Psych team indicates pt psychiatrically stable for d/c, and that pt can call [facility] after d/c from ED and facility subsequent rehab/treatment.   Pt alert, content. No tremor or shakes. Last drank 4 days ago. Normal mood/affect.  Pt appears stable for d/c.     Suzi RootsKevin E Tracey Stewart, MD 03/28/14 1444

## 2014-03-28 NOTE — ED Notes (Signed)
Pt. Doing his TTS.

## 2014-03-28 NOTE — ED Provider Notes (Signed)
CSN: [id number]     Arrival date & time 03/28/14  1001 History   First MD Initiated Contact with Patient 03/28/14 1012     Chief Complaint  Patient presents with  . Alcohol Problem  . 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 Patient reports that he went to [facility] and was told he needed to come to the emergency department for clearance. Patient reports a long history of alcohol and drug abuse. His last use was last night. He has not depressed, homicidal or suicidal. Patient denies chest pain, shortness of breath. No recent illness.  Patient is a 43 y.o. male presenting with alcohol problem.  Alcohol Problem Pertinent negatives include no chest pain.    Past Medical History  Diagnosis Date  . Benign essential HTN   . CAD (coronary artery disease)   . Unstable angina    Past Surgical History  Procedure Laterality Date  . Cardiac catheterization  05/15/07    with stenting  . Cardiac stents     History reviewed. No pertinent family history. History  Substance Use Topics  . Smoking status: Smoker, Current Status Unknown  . Smokeless tobacco: Not on file  . Alcohol Use: No    Review of Systems  Cardiovascular: Negative for chest pain.  Psychiatric/Behavioral: Negative for suicidal ideas and self-injury.  All other systems reviewed and are negative.     Allergies  Review of patient's allergies indicates no known allergies.  Home Medications   Prior to Admission medications   Medication Sig Start Date End Date Taking? Authorizing Provider  aspirin 325 MG tablet Take 325 mg by mouth daily.    Historical Provider, MD  cyclobenzaprine (FLEXERIL) 10 MG tablet Take 1 tablet (10 mg total) by mouth 2 (two) times daily as needed for muscle spasms. 11/24/13   Dorthula Matas, PA-C  HYDROcodone-acetaminophen  (NORCO/VICODIN) 5-325 MG per tablet 1 or 2 tabs PO q6 hours prn pain 11/16/13   Laray Anger, DO  meloxicam (MOBIC) 7.5 MG tablet Take 1 tablet (7.5 mg total) by mouth daily. 11/24/13   Tiffany Irine Seal, PA-C  methocarbamol (ROBAXIN) 500 MG tablet Take 2 tablets (1,000 mg total) by mouth 4 (four) times daily as needed for muscle spasms (muscle spasm/pain). 11/16/13   Laray Anger, DO  naproxen (NAPROSYN) 250 MG tablet Take 1 tablet (250 mg total) by mouth 2 (two) times daily with a meal. 11/16/13   Laray Anger, DO   BP 162/82  Pulse 87  Temp(Src) 98.6 F (37 C) (Oral)  Resp 15  Ht 5\' 4"  (1.626 m)  SpO2 98% Physical Exam  Constitutional: He is oriented to person, place, and time. He appears well-developed and well-nourished. No distress.  HENT:  Head: Normocephalic and atraumatic.  Right Ear: Hearing normal.  Left Ear: Hearing normal.  Nose: Nose normal.  Mouth/Throat: Oropharynx is clear and moist and mucous membranes are normal.  Eyes: Conjunctivae and EOM are normal. Pupils are equal, round, and reactive to light.  Neck: Normal range of motion. Neck supple.  Cardiovascular: Regular rhythm, S1 normal and S2 normal.  Exam reveals no gallop and no friction rub.   No murmur heard. Pulmonary/Chest: Effort normal and breath sounds normal. No respiratory distress. He exhibits no tenderness.  Abdominal: Soft. Normal appearance and bowel sounds are normal. There is no hepatosplenomegaly. There is no tenderness. There is no rebound, no guarding,  no tenderness at McBurney's point and negative Murphy's sign. No hernia.  Musculoskeletal: Normal range of motion.  Neurological: He is alert and oriented to person, place, and time. He has normal strength. No cranial nerve deficit or sensory deficit. Coordination normal. GCS eye subscore is 4. GCS verbal subscore is 5. GCS motor subscore is 6.  Skin: Skin is warm, dry and intact. No rash noted. No cyanosis.  Psychiatric: He has a normal  mood and affect. His speech is normal and behavior is normal. Thought content normal.    ED Course  Procedures (including critical care time) Labs Review Labs Reviewed  ACETAMINOPHEN LEVEL  CBC  COMPREHENSIVE METABOLIC PANEL  ETHANOL  SALICYLATE LEVEL  URINE RAPID DRUG SCREEN (HOSP PERFORMED)    Imaging Review No results found.   EKG Interpretation None      MDM   Final diagnoses:  None   alcohol abuse  Marijuana abuse  Patient presents for help with drug and alcohol addiction. Patient reports that he requires detox from alcohol and also has a problem with marijuana. Medical clearance performed, will require a behavioral health evaluation for help with proper addiction treatment.    Gilda Creasehristopher J. Adilynn Bessey, MD 03/28/14 1017

## 2014-03-28 NOTE — Discharge Instructions (Signed)
Our behavioral health team indicates for you to contact [facility] to facilitate follow up/treatment there - when you call, let them know that you had lab work done, and were felt to be medically stable. Return to ER if worse, new symptoms, medical emergency, other concern.     Alcohol Use Disorder Alcohol use disorder is a mental disorder. It is not a one-time incident of heavy drinking. Alcohol use disorder is the excessive and uncontrollable use of alcohol over time that leads to problems with functioning in one or more areas of daily living. People with this disorder risk harming themselves and others when they drink to excess. Alcohol use disorder also can cause other mental disorders, such as mood and anxiety disorders, and serious physical problems. People with alcohol use disorder often misuse other drugs.  Alcohol use disorder is common and widespread. Some people with this disorder drink alcohol to cope with or escape from negative life events. Others drink to relieve chronic pain or symptoms of mental illness. People with a family history of alcohol use disorder are at higher risk of losing control and using alcohol to excess.  SYMPTOMS  Signs and symptoms of alcohol use disorder may include the following:   Consumption ofalcohol inlarger amounts or over a longer period of time than intended.  Multiple unsuccessful attempts to cutdown or control alcohol use.   A great deal of time spent obtaining alcohol, using alcohol, or recovering from the effects of alcohol (hangover).  A strong desire or urge to use alcohol (cravings).   Continued use of alcohol despite problems at work, school, or home because of alcohol use.   Continued use of alcohol despite problems in relationships because of alcohol use.  Continued use of alcohol in situations when it is physically hazardous, such as driving a car.  Continued use of alcohol despite awareness of a physical or psychological problem  that is likely related to alcohol use. Physical problems related to alcohol use can involve the brain, heart, liver, stomach, and intestines. Psychological problems related to alcohol use include intoxication, depression, anxiety, psychosis, delirium, and dementia.   The need for increased amounts of alcohol to achieve the same desired effect, or a decreased effect from the consumption of the same amount of alcohol (tolerance).  Withdrawal symptoms upon reducing or stopping alcohol use, or alcohol use to reduce or avoid withdrawal symptoms. Withdrawal symptoms include:  Racing heart.  Hand tremor.  Difficulty sleeping.  Nausea.  Vomiting.  Hallucinations.  Restlessness.  Seizures. DIAGNOSIS Alcohol use disorder is diagnosed through an assessment by your health care provider. Your health care provider may start by asking three or four questions to screen for excessive or problematic alcohol use. To confirm a diagnosis of alcohol use disorder, at least two symptoms must be present within a 207-month period. The severity of alcohol use disorder depends on the number of symptoms:  Mild--two or three.  Moderate--four or five.  Severe--six or more. Your health care provider may perform a physical exam or use results from lab tests to see if you have physical problems resulting from alcohol use. Your health care provider may refer you to a mental health professional for evaluation. TREATMENT  Some people with alcohol use disorder are able to reduce their alcohol use to low-risk levels. Some people with alcohol use disorder need to quit drinking alcohol. When necessary, mental health professionals with specialized training in substance use treatment can help. Your health care provider can help you decide how severe  your alcohol use disorder is and what type of treatment you need. The following forms of treatment are available:   Detoxification. Detoxification involves the use of prescription  medicines to prevent alcohol withdrawal symptoms in the first week after quitting. This is important for people with a history of symptoms of withdrawal and for heavy drinkers who are likely to have withdrawal symptoms. Alcohol withdrawal can be dangerous and, in severe cases, cause death. Detoxification is usually provided in a hospital or in-patient substance use treatment facility.  Counseling or talk therapy. Talk therapy is provided by substance use treatment counselors. It addresses the reasons people use alcohol and ways to keep them from drinking again. The goals of talk therapy are to help people with alcohol use disorder find healthy activities and ways to cope with life stress, to identify and avoid triggers for alcohol use, and to handle cravings, which can cause relapse.  Medicines.Different medicines can help treat alcohol use disorder through the following actions:  Decrease alcohol cravings.  Decrease the positive reward response felt from alcohol use.  Produce an uncomfortable physical reaction when alcohol is used (aversion therapy).  Support groups. Support groups are run by people who have quit drinking. They provide emotional support, advice, and guidance. These forms of treatment are often combined. Some people with alcohol use disorder benefit from intensive combination treatment provided by specialized substance use treatment centers. Both inpatient and outpatient treatment programs are available. Document Released: 09/26/2004 Document Revised: 01/03/2014 Document Reviewed: 11/26/2012 [facility] Patient Information 2015 [place], [place]. This information is not intended to replace advice given to you by your health care provider. Make sure you discuss any questions you have with your health care provider.

## 2014-03-29 NOTE — Discharge Planning (Signed)
[facility] Community Liaison was not able to see patient, [facility] orange card information and resource guide will be sent to the address provided. °

## 2014-04-29 ENCOUNTER — Emergency Department (HOSPITAL_BASED_OUTPATIENT_CLINIC_OR_DEPARTMENT_OTHER): Payer: Self-pay

## 2014-04-29 ENCOUNTER — Emergency Department (HOSPITAL_BASED_OUTPATIENT_CLINIC_OR_DEPARTMENT_OTHER)
Admission: EM | Admit: 2014-04-29 | Discharge: 2014-04-29 | Disposition: A | Discharge status: Home / Self Care | Payer: Self-pay | Attending: Emergency Medicine | Admitting: Emergency Medicine

## 2014-04-29 ENCOUNTER — Encounter (HOSPITAL_BASED_OUTPATIENT_CLINIC_OR_DEPARTMENT_OTHER): Payer: Self-pay | Admitting: Emergency Medicine

## 2014-04-29 DIAGNOSIS — I10 Essential (primary) hypertension: Secondary | ICD-10-CM | POA: Insufficient documentation

## 2014-04-29 DIAGNOSIS — F1210 Cannabis abuse, uncomplicated: Secondary | ICD-10-CM | POA: Insufficient documentation

## 2014-04-29 DIAGNOSIS — I2510 Atherosclerotic heart disease of native coronary artery without angina pectoris: Secondary | ICD-10-CM | POA: Insufficient documentation

## 2014-04-29 DIAGNOSIS — Z7982 Long term (current) use of aspirin: Secondary | ICD-10-CM | POA: Insufficient documentation

## 2014-04-29 DIAGNOSIS — I200 Unstable angina: Secondary | ICD-10-CM | POA: Insufficient documentation

## 2014-04-29 DIAGNOSIS — F17200 Nicotine dependence, unspecified, uncomplicated: Secondary | ICD-10-CM | POA: Insufficient documentation

## 2014-04-29 DIAGNOSIS — R9431 Abnormal electrocardiogram [ECG] [EKG]: Secondary | ICD-10-CM | POA: Insufficient documentation

## 2014-04-29 DIAGNOSIS — R002 Palpitations: Secondary | ICD-10-CM | POA: Insufficient documentation

## 2014-04-29 DIAGNOSIS — Z9861 Coronary angioplasty status: Secondary | ICD-10-CM | POA: Insufficient documentation

## 2014-04-29 LAB — PROTIME-INR
INR: 1 (ref 0.00–1.49)
Prothrombin Time: 13.2 seconds (ref 11.6–15.2)

## 2014-04-29 LAB — COMPREHENSIVE METABOLIC PANEL
ALT: 45 U/L (ref 0–53)
AST: 19 U/L (ref 0–37)
Albumin: 3.7 g/dL (ref 3.5–5.2)
Alkaline Phosphatase: 64 U/L (ref 39–117)
Anion gap: 15 (ref 5–15)
BUN: 11 mg/dL (ref 6–23)
CO2: 25 mEq/L (ref 19–32)
Calcium: 9.9 mg/dL (ref 8.4–10.5)
Chloride: 103 mEq/L (ref 96–112)
Creatinine, Ser: 0.9 mg/dL (ref 0.50–1.35)
GFR calc Af Amer: >90 mL/min (ref >90)
GFR calc non Af Amer: >90 mL/min (ref >90)
Glucose, Bld: 147 mg/dL — ABNORMAL HIGH (ref 70–99)
Potassium: 3.7 mEq/L (ref 3.7–5.3)
Sodium: 143 mEq/L (ref 137–147)
Total Bilirubin: 0.2 mg/dL — ABNORMAL LOW (ref 0.3–1.2)
Total Protein: 7.8 g/dL (ref 6.0–8.3)

## 2014-04-29 LAB — URINALYSIS, ROUTINE W REFLEX MICROSCOPIC
Bilirubin Urine: NEGATIVE
Glucose, UA: NEGATIVE mg/dL
Hgb urine dipstick: NEGATIVE
Ketones, ur: NEGATIVE mg/dL
Leukocytes, UA: NEGATIVE
Nitrite: NEGATIVE
Protein, ur: NEGATIVE mg/dL
Specific Gravity, Urine: 1.017 (ref 1.005–1.030)
Urobilinogen, UA: 1 mg/dL (ref 0.0–1.0)
pH: 6 (ref 5.0–8.0)

## 2014-04-29 LAB — RAPID URINE DRUG SCREEN, HOSP PERFORMED
Amphetamines: NOT DETECTED
Barbiturates: NOT DETECTED
Benzodiazepines: NOT DETECTED
Cocaine: NOT DETECTED
Opiates: NOT DETECTED
Tetrahydrocannabinol: POSITIVE — AB

## 2014-04-29 LAB — CBC
HCT: 43 % (ref 39.0–52.0)
Hemoglobin: 14.5 g/dL (ref 13.0–17.0)
MCH: 30.3 pg (ref 26.0–34.0)
MCHC: 33.7 g/dL (ref 30.0–36.0)
MCV: 90 fL (ref 78.0–100.0)
Platelets: 290 10*3/uL (ref 150–400)
RBC: 4.78 MIL/uL (ref 4.22–5.81)
RDW: 13 % (ref 11.5–15.5)
WBC: 11.6 10*3/uL — ABNORMAL HIGH (ref 4.0–10.5)

## 2014-04-29 LAB — TROPONIN I: Troponin I: <0.3 ng/mL (ref <0.30)

## 2014-04-29 LAB — APTT: aPTT: 32 seconds (ref 24–37)

## 2014-04-29 MED ORDER — ASPIRIN 81 MG PO CHEW
324.0000 mg | CHEWABLE_TABLET | Freq: Once | ORAL | Status: AC
  Administered 2014-04-29: 324 mg via ORAL
  Filled 2014-04-29: qty 4

## 2014-04-29 MED ORDER — SODIUM CHLORIDE 0.9 % IV SOLN: 20.0000 mL | INTRAVENOUS | Status: DC

## 2014-04-29 MED ORDER — NITROGLYCERIN 0.4 MG SL SUBL: 0.4000 mg | SUBLINGUAL_TABLET | SUBLINGUAL | Status: DC | PRN

## 2014-04-29 NOTE — ED Provider Notes (Signed)
CSN: [id number]     Arrival date & time 04/29/14  1329 History   First MD Initiated Contact with Patient 04/29/14 1347     Chief Complaint  Patient presents with  . Palpitations     (Consider location/radiation/quality/duration/timing/severity/associated sxs/prior Treatment) HPI 43 y.o. Male states he had fifteen minutes of palpitations last night after smoking cigarettes and drinking coffee.  He is in detox for the past 30 days at [facility].  He is in treatment for alcohol abuse.  He states that he had a stent placed eight years ago at [facility] but did not have a mi.  He did not have any chest pain or discomfort.  He asked if he would be able to take an extra aspirin but was told he could not and was sent in secondary to that.  He denied that he had any complaints today and states none of this was like when he had chest pain 8 years ago and had his stent placed. He has not been seen by cardiology stating that he was told by his cardiologist that he did not need to followup with them.  He denies cocaine use.   Past Medical History  Diagnosis Date  . Benign essential HTN   . CAD (coronary artery disease)   . Unstable angina    Past Surgical History  Procedure Laterality Date  . Cardiac catheterization  05/15/07    with stenting  . Cardiac stents     No family history on file. History  Substance Use Topics  . Smoking status: Smoker, Current Status Unknown  . Smokeless tobacco: Not on file  . Alcohol Use: No    Review of Systems  All other systems reviewed and are negative.     Allergies  Review of patient's allergies indicates no known allergies.  Home Medications   Prior to Admission medications   Medication Sig Start Date End Date Taking? Authorizing Provider  aspirin 325 MG tablet Take 325 mg by mouth daily.    Historical Provider, MD  ibuprofen (ADVIL,MOTRIN) 200 MG tablet Take 800 mg by mouth every 6 (six) hours as needed for mild pain.    Historical Provider, MD   BP  148/87  Pulse 106  Temp(Src) 98.7 F (37.1 C) (Oral)  Resp 20  Ht  (1.753 m)  Wt 228 lb (103.42 kg)  BMI 33.65 kg/m2  SpO2 96% Physical Exam  Nursing note and vitals reviewed. Constitutional: He is oriented to person, place, and time. He appears well-developed and well-nourished.  HENT:  Head: Normocephalic and atraumatic.  Right Ear: External ear normal.  Left Ear: External ear normal.  Nose: Nose normal.  Mouth/Throat: Oropharynx is clear and moist.  Eyes: Conjunctivae and EOM are normal. Pupils are equal, round, and reactive to light.  Neck: Normal range of motion. Neck supple.  Cardiovascular: Normal rate, regular rhythm, normal heart sounds and intact distal pulses.   Pulmonary/Chest: Effort normal and breath sounds normal. No respiratory distress. He has no wheezes. He exhibits no tenderness.  Abdominal: Soft. Bowel sounds are normal. He exhibits no distension and no mass. There is no tenderness. There is no guarding.  Musculoskeletal: Normal range of motion.  Neurological: He is alert and oriented to person, place, and time. He has normal reflexes. He exhibits normal muscle tone. Coordination normal.  Skin: Skin is warm and dry.  Psychiatric: He has a normal mood and affect. His behavior is normal. Judgment and thought content normal.    ED Course  Procedures (including critical care time) Labs Review Labs Reviewed  CBC - Abnormal; Notable for the following:    WBC 11.6 (*)    All other components within normal limits  COMPREHENSIVE METABOLIC PANEL - Abnormal; Notable for the following:    Glucose, Bld 147 (*)    Total Bilirubin 0.2 (*)    All other components within normal limits  APTT  PROTIME-INR  URINALYSIS, ROUTINE W REFLEX MICROSCOPIC  TROPONIN I  URINE RAPID DRUG SCREEN (HOSP PERFORMED)    Imaging Review Dg Chest Portable 1 View  04/29/2014   CLINICAL DATA:  Heart palpitations  EXAM: PORTABLE CHEST - 1 VIEW  COMPARISON:  Chest radiograph 03/14/2007   FINDINGS: Stable enlarged cardiac and mediastinal contours. No consolidative pulmonary opacities no pleural effusion or pneumothorax. Regional skeleton is unremarkable.  IMPRESSION: No acute cardiopulmonary process.   Electronically Signed   By: Annia Belt M.D.   On: 04/29/2014 14:14     EKG Interpretation   Date/Time:  Friday April 29 2014 13:38:56 EDT Ventricular Rate:  101 PR Interval:  124 QRS Duration: 84 QT Interval:  362 QTC Calculation: 469 R Axis:   33 Text Interpretation:  Sinus tachycardia ST \\T \ T wave abnormality,  consider inferior ischemia Abnormal ECG Confirmed by Aslyn Cottman MD, Duwayne Heck  ([id number]) on 04/29/2014 1:45:40 PM      MDM   Final diagnoses:  Palpitations  Abnormal EKG    Patient with palpitation and abnormal ekg but last available ekg from 7 years ago.  AS he has no chest pain or anginal equivalent and normal troponin plan outpatient followup with cardiology.  Patient given return precautions and voices understanding.     Hilario Quarry, MD 04/29/14 [phone]

## 2014-04-29 NOTE — ED Notes (Signed)
He is a resident at [facility]. States they made him come get checked out for rapid heartrate after smoking and drinking coffee.

## 2014-04-29 NOTE — Discharge Instructions (Signed)
Recheck with your cardiologist.  Return if worse at any time.   Palpitations A palpitation is the feeling that your heartbeat is irregular. It may feel like your heart is fluttering or skipping a beat. It may also feel like your heart is beating faster than normal. This is usually not a serious problem. In some cases, you may need more medical tests. HOME CARE  Avoid:  Caffeine in coffee, tea, soft drinks, diet pills, and energy drinks.  Chocolate.  Alcohol.  Stop smoking if you smoke.  Reduce your stress and anxiety. Try:  A method that measures bodily functions so you can learn to control them (biofeedback).  Yoga.  Meditation.  Physical activity such as swimming, jogging, or walking.  Get plenty of rest and sleep. GET HELP IF:  Your fast or irregular heartbeat continues after 24 hours.  Your palpitations occur more often. GET HELP RIGHT AWAY IF:   You have chest pain.  You feel short of breath.  You have a very bad headache.  You feel dizzy or pass out (faint). MAKE SURE YOU:   Understand these instructions.  Will watch your condition.  Will get help right away if you are not doing well or get worse. Document Released: 05/28/2008 Document Revised: 01/03/2014 Document Reviewed: 10/18/2011 [facility] Patient Information 2015 [place], [place]. This information is not intended to replace advice given to you by your health care provider. Make sure you discuss any questions you have with your health care provider.

## 2014-07-30 ENCOUNTER — Emergency Department (HOSPITAL_COMMUNITY)
Admission: EM | Admit: 2014-07-30 | Discharge: 2014-07-30 | Disposition: A | Discharge status: Home / Self Care | Payer: Self-pay | Attending: Emergency Medicine | Admitting: Emergency Medicine

## 2014-07-30 ENCOUNTER — Encounter (HOSPITAL_COMMUNITY): Payer: Self-pay | Admitting: Emergency Medicine

## 2014-07-30 DIAGNOSIS — I10 Essential (primary) hypertension: Secondary | ICD-10-CM | POA: Insufficient documentation

## 2014-07-30 DIAGNOSIS — F1010 Alcohol abuse, uncomplicated: Secondary | ICD-10-CM | POA: Insufficient documentation

## 2014-07-30 DIAGNOSIS — I2510 Atherosclerotic heart disease of native coronary artery without angina pectoris: Secondary | ICD-10-CM | POA: Insufficient documentation

## 2014-07-30 DIAGNOSIS — Z720 Tobacco use: Secondary | ICD-10-CM | POA: Insufficient documentation

## 2014-07-30 DIAGNOSIS — Z7982 Long term (current) use of aspirin: Secondary | ICD-10-CM | POA: Insufficient documentation

## 2014-07-30 LAB — COMPREHENSIVE METABOLIC PANEL
ALT: 30 U/L (ref 0–53)
AST: 25 U/L (ref 0–37)
Albumin: 4.4 g/dL (ref 3.5–5.2)
Alkaline Phosphatase: 66 U/L (ref 39–117)
Anion gap: 21 — ABNORMAL HIGH (ref 5–15)
BUN: 12 mg/dL (ref 6–23)
CO2: 20 mEq/L (ref 19–32)
Calcium: 9.4 mg/dL (ref 8.4–10.5)
Chloride: 108 mEq/L (ref 96–112)
Creatinine, Ser: 0.94 mg/dL (ref 0.50–1.35)
GFR calc Af Amer: >90 mL/min (ref >90)
GFR calc non Af Amer: >90 mL/min (ref >90)
Glucose, Bld: 113 mg/dL — ABNORMAL HIGH (ref 70–99)
Potassium: 4.1 mEq/L (ref 3.7–5.3)
Sodium: 149 mEq/L — ABNORMAL HIGH (ref 137–147)
Total Bilirubin: 0.3 mg/dL (ref 0.3–1.2)
Total Protein: 9.1 g/dL — ABNORMAL HIGH (ref 6.0–8.3)

## 2014-07-30 LAB — CBC
HCT: 44 % (ref 39.0–52.0)
Hemoglobin: 14.6 g/dL (ref 13.0–17.0)
MCH: 29.9 pg (ref 26.0–34.0)
MCHC: 33.2 g/dL (ref 30.0–36.0)
MCV: 90 fL (ref 78.0–100.0)
Platelets: 249 10*3/uL (ref 150–400)
RBC: 4.89 MIL/uL (ref 4.22–5.81)
RDW: 13.8 % (ref 11.5–15.5)
WBC: 11.3 10*3/uL — ABNORMAL HIGH (ref 4.0–10.5)

## 2014-07-30 LAB — ACETAMINOPHEN LEVEL: Acetaminophen (Tylenol), Serum: <15 ug/mL (ref 10–30)

## 2014-07-30 LAB — SALICYLATE LEVEL: Salicylate Lvl: <2 mg/dL — ABNORMAL LOW (ref 2.8–20.0)

## 2014-07-30 LAB — ETHANOL: Alcohol, Ethyl (B): 213 mg/dL — ABNORMAL HIGH (ref 0–11)

## 2014-07-30 NOTE — ED Notes (Signed)
Pt presents voluntary by [employer], pt requesting detox from etoh and marijuana. Pt resistive to conversation initially then began sobbing, stating he has no job, no home, wife and kids no longer want to see him. Pt denies SI/HI. Pt unable to state how much he has consumed today or daily

## 2014-07-30 NOTE — Discharge Instructions (Signed)
You were seen in the ED for alcohol use.  I feel you are medically stable.  Your labs have been unremarkable.  I do not feel you need detox from alcohol.  I feel you are safe to be discharged with a sober driver and you can seek rehab from alcohol as an outpatient.  Alcohol Use Disorder Alcohol use disorder is a mental disorder. It is not a one-time incident of heavy drinking. Alcohol use disorder is the excessive and uncontrollable use of alcohol over time that leads to problems with functioning in one or more areas of daily living. People with this disorder risk harming themselves and others when they drink to excess. Alcohol use disorder also can cause other mental disorders, such as mood and anxiety disorders, and serious physical problems. People with alcohol use disorder often misuse other drugs.  Alcohol use disorder is common and widespread. Some people with this disorder drink alcohol to cope with or escape from negative life events. Others drink to relieve chronic pain or symptoms of mental illness. People with a family history of alcohol use disorder are at higher risk of losing control and using alcohol to excess.  SYMPTOMS  Signs and symptoms of alcohol use disorder may include the following:   Consumption ofalcohol inlarger amounts or over a longer period of time than intended.  Multiple unsuccessful attempts to cutdown or control alcohol use.   A great deal of time spent obtaining alcohol, using alcohol, or recovering from the effects of alcohol (hangover).  A strong desire or urge to use alcohol (cravings).   Continued use of alcohol despite problems at work, school, or home because of alcohol use.   Continued use of alcohol despite problems in relationships because of alcohol use.  Continued use of alcohol in situations when it is physically hazardous, such as driving a car.  Continued use of alcohol despite awareness of a physical or psychological problem that is  likely related to alcohol use. Physical problems related to alcohol use can involve the brain, heart, liver, stomach, and intestines. Psychological problems related to alcohol use include intoxication, depression, anxiety, psychosis, delirium, and dementia.   The need for increased amounts of alcohol to achieve the same desired effect, or a decreased effect from the consumption of the same amount of alcohol (tolerance).  Withdrawal symptoms upon reducing or stopping alcohol use, or alcohol use to reduce or avoid withdrawal symptoms. Withdrawal symptoms include:  Racing heart.  Hand tremor.  Difficulty sleeping.  Nausea.  Vomiting.  Hallucinations.  Restlessness.  Seizures. DIAGNOSIS Alcohol use disorder is diagnosed through an assessment by your health care provider. Your health care provider may start by asking three or four questions to screen for excessive or problematic alcohol use. To confirm a diagnosis of alcohol use disorder, at least two symptoms must be present within a 80-month period. The severity of alcohol use disorder depends on the number of symptoms:  Mild--two or three.  Moderate--four or five.  Severe--six or more. Your health care provider may perform a physical exam or use results from lab tests to see if you have physical problems resulting from alcohol use. Your health care provider may refer you to a mental health professional for evaluation. TREATMENT  Some people with alcohol use disorder are able to reduce their alcohol use to low-risk levels. Some people with alcohol use disorder need to quit drinking alcohol. When necessary, mental health professionals with specialized training in substance use treatment can help. Your health care provider  can help you decide how severe your alcohol use disorder is and what type of treatment you need. The following forms of treatment are available:   Detoxification. Detoxification involves the use of prescription  medicines to prevent alcohol withdrawal symptoms in the first week after quitting. This is important for people with a history of symptoms of withdrawal and for heavy drinkers who are likely to have withdrawal symptoms. Alcohol withdrawal can be dangerous and, in severe cases, cause death. Detoxification is usually provided in a hospital or in-patient substance use treatment facility.  Counseling or talk therapy. Talk therapy is provided by substance use treatment counselors. It addresses the reasons people use alcohol and ways to keep them from drinking again. The goals of talk therapy are to help people with alcohol use disorder find healthy activities and ways to cope with life stress, to identify and avoid triggers for alcohol use, and to handle cravings, which can cause relapse.  Medicines.Different medicines can help treat alcohol use disorder through the following actions:  Decrease alcohol cravings.  Decrease the positive reward response felt from alcohol use.  Produce an uncomfortable physical reaction when alcohol is used (aversion therapy).  Support groups. Support groups are run by people who have quit drinking. They provide emotional support, advice, and guidance. These forms of treatment are often combined. Some people with alcohol use disorder benefit from intensive combination treatment provided by specialized substance use treatment centers. Both inpatient and outpatient treatment programs are available. Document Released: 09/26/2004 Document Revised: 01/03/2014 Document Reviewed: 11/26/2012 [facility] Patient Information 2015 [employer] [place]. This information is not intended to replace advice given to you by your health care provider. Make sure you discuss any questions you have with your health care provider.    Emergency Department Resource Guide 1) Find a Doctor and Pay Out of Pocket Although you won't have to find out who is covered by your insurance plan, it is a good idea  to ask around and get recommendations. You will then need to call the office and see if the doctor you have chosen will accept you as a new patient and what types of options they offer for patients who are self-pay. Some doctors offer discounts or will set up payment plans for their patients who do not have insurance, but you will need to ask so you aren't surprised when you get to your appointment.  2) Contact Your Local Health Department Not all health departments have doctors that can see patients for sick visits, but many do, so it is worth a call to see if yours does. If you don't know where your local health department is, you can check in your phone book. The CDC also has a tool to help you locate your state's health department, and many state websites also have listings of all of their local health departments.  3) Find a Walk-in Clinic If your illness is not likely to be very severe or complicated, you may want to try a walk in clinic. These are popping up all over the country in pharmacies, drugstores, and shopping centers. They're usually staffed by nurse practitioners or physician assistants that have been trained to treat common illnesses and complaints. They're usually fairly quick and inexpensive. However, if you have serious medical issues or chronic medical problems, these are probably not your best option.  No Primary Care Doctor: - Call Health Connect at  [phone] - they can help you locate a primary care doctor that  accepts your insurance, provides  certain services, etc. - Physician Referral Service- [phone]  Chronic Pain Problems: Organization         Address  Phone   Notes  [facility]  [phone] Patients need to be referred by their primary care doctor.   Medication Assistance: Organization         Address  Phone   Notes  [facility] Medication [facility] [address] [place], [place] [address] [phone] --Must  be a resident of [facility] -- Must have NO insurance coverage whatsoever (no Medicaid/ Medicare, etc.) -- The pt. MUST have a primary care doctor that directs their care regularly and follows them in the community   [facility]  [phone]   [employer]  [phone]    Agencies that provide inexpensive medical care: Organization         Address  Phone   Notes  [facility]  [phone]   [facility]    [phone]   [facility] [address] [place], [place] [address] [phone]   [facility] [address], [place] [phone]   Planned Parenthood    [phone]   [facility]    [phone]   [facility]  [address], [place] Phone:  [phone], Fax:  [phone] Hours of Operation:  9 am - 6 pm, M-F.  Also accepts Medicaid/Medicare and self-pay.  [facility]  [address], [place] Phone: [phone], Fax: [phone]. Hours of Operation:  8:30 am - 5:30 pm, M-F.  Also accepts Medicaid and self-pay.  [facility] [place] [address], [place] Phone: [phone]   [facility] [address] Natasha Bence [place], [place] [phone], Ext. [phone] Mondays & Thursdays: 7-9 AM.  First 15 patients are seen on a first come, first serve basis.    Medicaid-accepting [facility] Providers:  Organization         Address  Phone   Notes  [facility] [address], [place] [phone] Also accepts self-pay patients.  [facility] [address] Laurell Josephs [place], [place]  [phone]   [facility] [address], [place] [phone]   [facility] [address], [place] [phone]   Renaye Rakers [address], [place]   [phone] Only accepts  [place] Access [place] patients after they have their name applied to their card.   Self-Pay (no insurance) in [facility]:  Organization         Address  Phone   Notes  Sickle Cell Patients, [facility] Internal Medicine [address] [place], [place] [phone]   [facility] [address] [place], [place] [phone]   [facility] [place]  [address], [place] [phone]   [facility]/Dr. Osei-Bonsu  [address] [place] or [address], [place] [phone] Phone number for both [place] and [place] locations is the same.  [facility]gent Medical and [facility] [address] [place] [phone]   [facility] [address] [place] or [address] [phone]   [facility] [address]  [address], [place] [phone], phone; [phone], fax Sees patients 1st and 3rd Saturday of every month.  Must not qualify for public or private insurance (i.e. Medicaid, Medicare, [place] Health Choice, Veterans' Benefits)  Household income should be no more than 200% of the poverty level The clinic cannot treat you if you are pregnant or think you are pregnant  Sexually transmitted diseases are not treated at the clinic.    Dental Care: Organization         Address  Phone  Notes  [facility] [address], [place] [phone] Accepts children up to age 42 who are enrolled in [place] or [place] Health Choice; pregnant women with a Medicaid card; and children who have applied for Medicaid or [place] Health Choice, but were declined, whose parents can pay a reduced fee at time of service.  [facility] [place]  [address] [place] [address], [place] [phone] Accepts children up to age 821 who are enrolled in [place] or [place] Health Choice; pregnant women  with a Medicaid card; and children who have applied for Medicaid or [place] Health Choice, but were declined, whose parents can pay a reduced fee at time of service.  [facility]  [address], [place] [phone] Patients are seen by appointment only. Walk-ins are not accepted. [facility] will see patients 42 years of age and older. Monday - Tuesday (8am-5pm) Most Wednesdays (8:30-5pm) $30 per visit, cash only  [facility]  [address], [facility] [phone] Patients are seen by appointment only. Walk-ins are not accepted. [facility] will see patients 42 years of age and older. One Wednesday Evening (Monthly: Volunteer Based).  $30 per visit, cash only  [employer] [facility]  [phone]) [phone] for adults; Children under age 494, call Graduate Pediatric Dentistry at [phone]. Children aged 654-14, please call [phone] to request a pediatric application.  Dental services are provided in all areas of dental care including fillings, crowns and bridges, complete and partial dentures, implants, gum treatment, root canals, and extractions. Preventive care is also provided. Treatment is provided to both adults and children. Patients are selected via a lottery and there is often a waiting list.   [facility] [address], [place]  [phone] [email or web address]   [facility] [address], [place], [place] [phone], Ext. [phone] Second and Fourth Thursday of each month, opens at 6:30 AM; Clinic ends at 9 AM.  Patients are seen on a first-come first-served basis, and a limited number are seen during each clinic.   [facility]  [address], [place], [place] [phone]   Eligibility Requirements You must have lived in [place], [place], or [place] counties for at least the last three months.   You cannot be eligible for state or federal sponsored [employer]healthcare  insurance, including CIGNAVeterans Administration, [place]icaid, or [employer] EntertainmentMedicare.   You generally cannot be eligible for healthcare insurance through your employer.    How to apply: Eligibility screenings are held every Tuesday and Wednesday afternoon from 1:00 pm until 4:00 pm. You do not need an appointment for the interview!  [facility] [address], [place], [place] [phone]   [facility]  [phone]   [facility]  [phone]   [facility]  [phone]  Behavioral Health Resources in the Community: Intensive Outpatient Programs Organization         Address  Phone  Notes  [facility] [address] [place], [place] [phone]   [facility] Outpatient [address] [place], [place] [phone]   [facility] [address] [place], [place]  [phone]   [facility] Mental Health [address]  [place], [place] [phone] or [phone]   Substance Abuse Resources Organization         Address  Phone  Notes  [facility] Drug Services  [phone]   [facility]  [phone]   [facility]  [phone]   Floydene Flock  [phone]   Residential & Outpatient Substance Abuse Program  [phone]   Psychological Services Organization         Address  Phone  Notes  [facility]  [phone]   [facility] Services  [phone]   [facility] [address], [place] [phone] or [phone]    Mobile Crisis Teams Organization         Address  Phone  Notes  [facility], Mobile Crisis Care Unit  [phone]   [facility]  [address] [place], [place] [phone]   Doristine Locks [address] [place] [phone]    Self-Help/Support Groups Organization         Address  Phone             Notes  [facility]  [facility] [place] - variety of support groups  [phone]- [phone] Call for more information  Narcotics Anonymous (NA), [facility] [address], [employer] [place]  2 meetings at this location   Statistician         Address  Phone  Notes  [facility] [address],    [place]  [phone]   [facility]  [address], [place] [address], [place], [place] [phone]   [facility] [address] [place], [place] [phone] Admissions: 8am-3pm M-F  [facility] [address],    [place], [place] [phone]   [facility] [address] [place], [place], [place] [phone]   [facility] [address]  [place], [place] [phone]   [facility] - Intensive Outpatient [address] Laurell Josephs [address], [place], [place] [phone]   [facility] [address] [place]  [place], [place] [phone] or [phone]   [facility] ([facility]) [address] [place], [place] [phone] Accepts Medicaid  [facility] [address],  [place] [phone] Substance Abuse/Addiction Treatment   [facility] Organization         Address  Phone  Notes  [facility]  [phone]   Angie Fava, PhD [address] Ervin Knack [place], [place]   [phone] or [phone]   [facility] Behavioral   [address] [place], [place] [phone]   [facility] [address], [place], [place] [phone] Insurance/Medicaid/sponsorship through [employer] and Families [address]                                    [place], [place] [phone] Therapy/tele-psych/case  [facility] [address]   [place],  [place] [phone]    Dr. Lolly Mustache  [phone]   [facility] [place]  [employer] [facility] Dept. 1) [address],  [place] 2) [address] [place] 3)  [address], [place] [phone]  [phone]   [facility] Child Abuse Hotline [phone] or [phone] (After Hours)

## 2014-07-30 NOTE — ED Notes (Signed)
Pt to be transported by [facility] to location

## 2014-07-30 NOTE — ED Provider Notes (Signed)
TIME SEEN: 10:35 PM  CHIEF COMPLAINT: Alcohol abuse  HPI: Pt is a 43 y.o. M history of hypertension, CAD who presents emergency department requesting rehabilitation from alcohol and marijuana. He states that he's recently lost his job, home and his wife and children along with see him he started drinking again for the past 3 days. He reports today he has had 2 25 ounce beers and 2 shots. He reports he's never had withdrawal seizures, DTs. He was sober for several months. He is also smoking marijuana but no other drug use. Denies SI, HI. Denies any medical complaints.  ROS: See HPI Constitutional: no fever  Eyes: no drainage  ENT: no runny nose   Cardiovascular:  no chest pain  Resp: no SOB  GI: no vomiting GU: no dysuria Integumentary: no rash  Allergy: no hives  Musculoskeletal: no leg swelling  Neurological: no slurred speech ROS otherwise negative  PAST MEDICAL HISTORY/PAST SURGICAL HISTORY:  Past Medical History  Diagnosis Date  . Benign essential HTN   . CAD (coronary artery disease)   . Unstable angina     MEDICATIONS:  Prior to Admission medications   Medication Sig Start Date End Date Taking? Authorizing Provider  aspirin 325 MG tablet Take 325 mg by mouth daily.    Historical Provider, MD  ibuprofen (ADVIL,MOTRIN) 200 MG tablet Take 800 mg by mouth every 6 (six) hours as needed for mild pain.    Historical Provider, MD    ALLERGIES:  No Known Allergies  SOCIAL HISTORY:  History  Substance Use Topics  . Smoking status: Smoker, Current Status Unknown  . Smokeless tobacco: Not on file  . Alcohol Use: Yes    FAMILY HISTORY: No family history on file.  EXAM: BP 133/81 mmHg  Pulse 108  Temp(Src) 98.7 F (37.1 C) (Oral)  Resp 18  Ht 5\' 4"  (1.626 m)  Wt 225 lb (102.059 kg)  BMI 38.60 kg/m2  SpO2 97% CONSTITUTIONAL: Alert and oriented and responds appropriately to questions. Well-appearing; well-nourished HEAD: Normocephalic EYES: Conjunctivae clear,  PERRL ENT: normal nose; no rhinorrhea; moist mucous membranes; pharynx without lesions noted NECK: Supple, no meningismus, no LAD  CARD: RRR; S1 and S2 appreciated; no murmurs, no clicks, no rubs, no gallops RESP: Normal chest excursion without splinting or tachypnea; breath sounds clear and equal bilaterally; no wheezes, no rhonchi, no rales,  ABD/GI: Normal bowel sounds; non-distended; soft, non-tender, no rebound, no guarding BACK:  The back appears normal and is non-tender to palpation, there is no CVA tenderness EXT: Normal ROM in all joints; non-tender to palpation; no edema; normal capillary refill; no cyanosis    SKIN: Normal color for age and race; warm NEURO: Moves all extremities equally; sensation to light touch intact diffusely, cranial nerves II through XII intact PSYCH: The patient's mood and manner are appropriate. Grooming and personal hygiene are appropriate.  Denies SI, HI or hallucinations.  MEDICAL DECISION MAKING: Pt here requesting information for alcohol rehabilitation. [facility]  Reports he is only been drinking for 3 days. Has never had a history of alcohol withdrawal. I do not feel he needs inpatient detox at this time and he agrees. His labs are unremarkable other than alcohol level of 213. He has no current medical complaints. No signs of trauma. Hemodynamically stable, neurologically intact. I feel he is safe to be discharged home with outpatient resources. He is comfortable with this plan. He will have a sober driver come to pick him up.  Discussed return precautions. He verbalizes understanding.      Layla MawKristen N Marysol Wellnitz, DO 07/30/14 2315

## 2014-07-30 NOTE — ED Notes (Signed)
Pt sobbing loudly, pt repeatedly stating he has nothing, upset he is unable to find job to take care of his family. Pt states he was clean but has since relapsed. Pt unwilling to have labs drawn until speaking with Joy, pt now willing to stay.

## 2014-12-29 ENCOUNTER — Encounter (HOSPITAL_COMMUNITY): Payer: Self-pay | Admitting: Emergency Medicine

## 2014-12-29 ENCOUNTER — Emergency Department (INDEPENDENT_AMBULATORY_CARE_PROVIDER_SITE_OTHER)
Admission: EM | Admit: 2014-12-29 | Discharge: 2014-12-29 | Disposition: A | Discharge status: Home / Self Care | Payer: Self-pay | Source: Home / Self Care | Attending: Emergency Medicine | Admitting: Emergency Medicine

## 2014-12-29 DIAGNOSIS — M5441 Lumbago with sciatica, right side: Secondary | ICD-10-CM

## 2014-12-29 MED ORDER — HYDROCODONE-ACETAMINOPHEN 5-325 MG PO TABS
ORAL_TABLET | ORAL | Status: AC
  Filled 2014-12-29: qty 1

## 2014-12-29 MED ORDER — TRAMADOL HCL 50 MG PO TABS: 50.0000 mg | ORAL_TABLET | Freq: Four times a day (QID) | ORAL | Status: DC | PRN

## 2014-12-29 MED ORDER — IBUPROFEN 800 MG PO TABS: 800.0000 mg | ORAL_TABLET | Freq: Three times a day (TID) | ORAL | Status: DC | PRN

## 2014-12-29 MED ORDER — HYDROCODONE-ACETAMINOPHEN 5-325 MG PO TABS
1.0000 | ORAL_TABLET | Freq: Once | ORAL | Status: AC
  Administered 2014-12-29: 1 via ORAL

## 2014-12-29 MED ORDER — PREDNISONE 50 MG PO TABS: ORAL_TABLET | ORAL | Status: DC

## 2014-12-29 NOTE — Discharge Instructions (Signed)
I think you did something that irritated your sciatic nerve. Take prednisone 1 pill daily for 5 days. Take ibuprofen 800 mg 3 times a day for the next 5 days, then as needed. Use the tramadol every 6-8 hours as needed for severe pain. Do not take this medicine if you are driving. You should see improvement in the next 5 days, but it may take another 2-3 weeks to fully resolve.

## 2014-12-29 NOTE — ED Notes (Signed)
Pt states that he has had back pain for 3 weeks. Pt denies any injury or fall

## 2014-12-29 NOTE — ED Provider Notes (Signed)
CSN: [id number]     Arrival date & time 12/29/14  1005 History   First MD Initiated Contact with Patient 12/29/14 1057     Chief Complaint  Patient presents with  . Back Pain   (Consider location/radiation/quality/duration/timing/severity/associated sxs/prior Treatment) HPI  He is a 44 year old man here for right lower back pain. He states it has been present for about 3 weeks. He denies any falls, injury, trauma to the area. He does not remember any triggering event. The pain is in the right lower back and will radiate down the right leg to the knee. Since this associated with subjective right leg weakness. No numbness, tingling. No bowel or bladder incontinence. Initially, ibuprofen and a muscle relaxer would temporarily relieve the pain.  Past Medical History  Diagnosis Date  . Benign essential HTN   . CAD (coronary artery disease)   . Unstable angina    Past Surgical History  Procedure Laterality Date  . Cardiac catheterization  05/15/07    with stenting  . Cardiac stents     History reviewed. No pertinent family history. History  Substance Use Topics  . Smoking status: Current Every Day Smoker -- 1.00 packs/day    Types: Cigarettes  . Smokeless tobacco: Not on file  . Alcohol Use: Yes    Review of Systems As in history of present illness Allergies  Review of patient's allergies indicates no known allergies.  Home Medications   Prior to Admission medications   Medication Sig Start Date End Date Taking? Authorizing Provider  aspirin 325 MG tablet Take 325 mg by mouth daily.    Historical Provider, MD  ibuprofen (ADVIL,MOTRIN) 800 MG tablet Take 1 tablet (800 mg total) by mouth every 8 (eight) hours as needed. 12/29/14   Charm RingsErin J Honig, MD  predniSONE (DELTASONE) 50 MG tablet Take 1 pill daily for 5 days. 12/29/14   Charm RingsErin J Honig, MD  traMADol (ULTRAM) 50 MG tablet Take 1 tablet (50 mg total) by mouth every 6 (six) hours as needed. 12/29/14   Charm RingsErin J Honig, MD   BP 143/99 mmHg   Pulse 79  Temp(Src) 98.4 F (36.9 C) (Oral)  Resp 16  SpO2 97% Physical Exam  Constitutional: He is oriented to person, place, and time. He appears well-developed and well-nourished. No distress.  Cardiovascular: Normal rate.   Pulmonary/Chest: Effort normal.  Musculoskeletal:  Back: No erythema or edema. No vertebral tenderness or step-offs. No appreciable muscle spasm. He is tender over the right SI area. No piriformis tenderness, no greater trochanter tenderness. He has 5 out of 5 strength in bilateral lower extremities, but he does have pain with right hip flexion. Negative straight leg raise and Faber.  Neurological: He is alert and oriented to person, place, and time.    ED Course  Procedures (including critical care time) Labs Review Labs Reviewed - No data to display  Imaging Review No results found.   MDM   1. Right-sided low back pain with right-sided sciatica    Norco 5-325 milligrams, one tablet given.  Will treat with a five-day course of prednisone. Recommended ibuprofen 800 mg 3 times a day for 5 days, then as needed. Tramadol prescription to use as needed for severe pain. Follow-up as needed.    Charm RingsErin J Honig, MD 12/29/14 [phone]

## 2018-10-19 ENCOUNTER — Emergency Department (HOSPITAL_COMMUNITY)
Admission: EM | Admit: 2018-10-19 | Discharge: 2018-10-20 | Disposition: A | Discharge status: Home / Self Care | Payer: BLUE CROSS/BLUE SHIELD | Attending: Emergency Medicine | Admitting: Emergency Medicine

## 2018-10-19 ENCOUNTER — Emergency Department (HOSPITAL_COMMUNITY): Payer: BLUE CROSS/BLUE SHIELD

## 2018-10-19 ENCOUNTER — Other Ambulatory Visit: Payer: Self-pay

## 2018-10-19 DIAGNOSIS — Y999 Unspecified external cause status: Secondary | ICD-10-CM | POA: Insufficient documentation

## 2018-10-19 DIAGNOSIS — Y929 Unspecified place or not applicable: Secondary | ICD-10-CM | POA: Insufficient documentation

## 2018-10-19 DIAGNOSIS — Z7982 Long term (current) use of aspirin: Secondary | ICD-10-CM | POA: Insufficient documentation

## 2018-10-19 DIAGNOSIS — F1290 Cannabis use, unspecified, uncomplicated: Secondary | ICD-10-CM | POA: Insufficient documentation

## 2018-10-19 DIAGNOSIS — Y9389 Activity, other specified: Secondary | ICD-10-CM | POA: Insufficient documentation

## 2018-10-19 DIAGNOSIS — W208XXA Other cause of strike by thrown, projected or falling object, initial encounter: Secondary | ICD-10-CM | POA: Insufficient documentation

## 2018-10-19 DIAGNOSIS — I2510 Atherosclerotic heart disease of native coronary artery without angina pectoris: Secondary | ICD-10-CM | POA: Insufficient documentation

## 2018-10-19 DIAGNOSIS — F17210 Nicotine dependence, cigarettes, uncomplicated: Secondary | ICD-10-CM | POA: Insufficient documentation

## 2018-10-19 DIAGNOSIS — I10 Essential (primary) hypertension: Secondary | ICD-10-CM | POA: Insufficient documentation

## 2018-10-19 DIAGNOSIS — S52572A Other intraarticular fracture of lower end of left radius, initial encounter for closed fracture: Secondary | ICD-10-CM | POA: Insufficient documentation

## 2018-10-19 MED ORDER — ACETAMINOPHEN 325 MG PO TABS
650.0000 mg | ORAL_TABLET | Freq: Once | ORAL | Status: AC
  Administered 2018-10-19: 650 mg via ORAL
  Filled 2018-10-19: qty 2

## 2018-10-19 NOTE — ED Triage Notes (Signed)
Pt states that he was changing his car battery when the hood of the car fell onto his left arm. C/o 10/10 pain, states that he has tried ibuprofen but that it hasn't helped. States that injury occurred a couple of days ago but he is not sure when.

## 2018-10-19 NOTE — ED Provider Notes (Signed)
[facility] EMERGENCY DEPARTMENT Provider Note   CSN: [id number] Arrival date & time: 10/19/18  1944    History   Chief Complaint Chief Complaint  Patient presents with  . left arm pain    HPI [name] [name] is a 48 y.o. male.     HPI   Pt is a 48 y/o male with a h/o HTN, CAD, who presents to the ED today for evaluation of left arm pain that began suddenly PTA. He was working on a car when the hood of the car fell on top of his left arm. Pain has been constant since onset. Rates pain 10/10. No abrasion to the skin. Denies numbness to the LUE.   Past Medical History:  Diagnosis Date  . Benign essential HTN   . CAD (coronary artery disease)   . Unstable angina     Patient Active Problem List   Diagnosis Date Noted  . HYPERTENSION, BENIGN ESSENTIAL 06/04/2007  . CORONARY ARTERY DISEASE 06/04/2007  . UNSTABLE ANGINA 05/16/2007    Past Surgical History:  Procedure Laterality Date  . CARDIAC CATHETERIZATION  05/15/07   with stenting  . cardiac stents          Home Medications    Prior to Admission medications   Medication Sig Start Date End Date Taking? Authorizing Provider  aspirin 325 MG tablet Take 325 mg by mouth daily.    [provider]  ibuprofen (ADVIL,MOTRIN) 800 MG tablet Take 1 tablet (800 mg total) by mouth every 8 (eight) hours as needed. 12/29/14   Charm Rings, MD  oxyCODONE-acetaminophen (PERCOCET/ROXICET) 5-325 MG tablet Take 1 tablet by mouth every 8 (eight) hours as needed for severe pain. 10/20/18   Conya Ellinwood S, PA-C  predniSONE (DELTASONE) 50 MG tablet Take 1 pill daily for 5 days. 12/29/14   Charm Rings, MD  traMADol (ULTRAM) 50 MG tablet Take 1 tablet (50 mg total) by mouth every 6 (six) hours as needed. 12/29/14   Charm Rings, MD    Family History No family history on file.  Social History Social History   Tobacco Use  . Smoking status: Current Every Day Smoker    Packs/day: 1.00    Types:  Cigarettes  Substance Use Topics  . Alcohol use: Yes  . Drug use: Yes    Types: Marijuana    Comment: daily     Allergies   Patient has no known allergies.   Review of Systems Review of Systems  Constitutional: Negative for fever.  Musculoskeletal:       Left arm pain  Skin: Negative for wound.  Neurological: Negative for weakness and numbness.     Physical Exam Updated Vital Signs BP 119/86 (BP Location: Right Arm)   Pulse (!) 115   Temp 98.1 F (36.7 C) (Oral)   Resp 18   Ht 5\' 3"  (1.6 m)   Wt 99.3 kg   SpO2 98%   BMI 38.79 kg/m   Physical Exam Constitutional:      General: He is not in acute distress.    Appearance: He is well-developed.  Eyes:     Conjunctiva/sclera: Conjunctivae normal.  Cardiovascular:     Rate and Rhythm: Normal rate and regular rhythm.  Pulmonary:     Effort: Pulmonary effort is normal.     Breath sounds: Normal breath sounds.  Musculoskeletal:     Comments: Swelling of the dorsum of the left distal forearm.  There is tenderness to the  mid and distal aspects of the left radius and ulna.  Minimal tenderness to the carpal bones.  Diffuse tenderness to the left elbow as well.  No snuffbox tenderness.  No tenderness throughout the hand.  Decreased range of motion of the wrist and decreased grip strength secondary to pain.  Radial pulses intact.  Neurovascularly intact.  Skin:    General: Skin is warm and dry.  Neurological:     Mental Status: He is alert and oriented to person, place, and time.     ED Treatments / Results  Labs (all labs ordered are listed, but only abnormal results are displayed) Labs Reviewed - No data to display  EKG None  Radiology Dg Elbow Complete Left  Result Date: 10/19/2018 CLINICAL DATA:  Initial evaluation for acute trauma, injury. EXAM: LEFT ELBOW - COMPLETE 3+ VIEW COMPARISON:  None. FINDINGS: No acute fracture dislocation. No joint effusion. Radial head intact. Mild degenerative spurring noted at  the olecranon. Osseous mineralization normal. No soft tissue abnormality. IMPRESSION: Negative. Electronically Signed   By: Rise MuBenjamin  McClintock M.D.   On: 10/19/2018 23:59   Dg Forearm Left  Result Date: 10/19/2018 CLINICAL DATA:  Pain EXAM: LEFT FOREARM - 2 VIEW COMPARISON:  None. FINDINGS: No significant elbow effusion. Acute comminuted and impacted distal radius fracture. IMPRESSION: Acute comminuted and impacted distal radius fracture Electronically Signed   By: Jasmine PangKim  Fujinaga M.D.   On: 10/19/2018 23:58   Dg Wrist Complete Left  Result Date: 10/19/2018 CLINICAL DATA:  Wrist pain EXAM: LEFT WRIST - COMPLETE 3+ VIEW COMPARISON:  None. FINDINGS: Acute comminuted and impacted fracture involving the distal radius with likely intra-articular involvement. No significant angulation IMPRESSION: Acute impacted and likely intra-articular distal radius fracture. Electronically Signed   By: Jasmine PangKim  Fujinaga M.D.   On: 10/19/2018 23:59    Procedures Procedures (including critical care time) SPLINT APPLICATION Date/Time: 1:32 AM Authorized by: Karrie Meresortni S Rylea Selway Consent: Verbal consent obtained. Risks and benefits: risks, benefits and alternatives were discussed Consent given by: patient Splint applied by: orthopedic technician Location details: LUE Splint type: short arm, sugar tong Post-procedure: The splinted body part was neurovascularly unchanged following the procedure. Patient tolerance: Patient tolerated the procedure well with no immediate complications.   Medications Ordered in ED Medications  acetaminophen (TYLENOL) tablet 650 mg (650 mg Oral Given 10/19/18 2245)  oxyCODONE-acetaminophen (PERCOCET/ROXICET) 5-325 MG per tablet 1 tablet (1 tablet Oral Given 10/20/18 0103)     Initial Impression / Assessment and Plan / ED Course  I have reviewed the triage vital signs and the nursing notes.  Pertinent labs & imaging results that were available during my care of the patient were reviewed by  me and considered in my medical decision making (see chart for details).        Final Clinical Impressions(s) / ED Diagnoses   Final diagnoses:  Other closed intra-articular fracture of distal end of left radius, initial encounter   Patient presenting with pain to left upper extremity after car hood fell on top of his arm.  He does have some soft tissue swelling and some tenderness to the left forearm, elbow and wrist on exam.  He is neurovascularly intact.  He does have some decreased range of motion and decrease strength secondary to pain.  No gross deformity on exam.   X-ray of the left elbow negative X-ray left forearm and wrist left wrist demonstrates  Acute impacted and likely intra-articular distal radius fracture.  Attempted to consult orthopedics to inform Dr.  Janee Morn of referral but was unsuccessful at making contact.   Pt informed of results. Splint placed. referral for orthopedics given for appropriate f/u. Pain medications given. Reviewed records in [facility] and there are no red flags. Pt advised to return to the ed for new or worsening symptoms.    ED Discharge Orders         Ordered    oxyCODONE-acetaminophen (PERCOCET/ROXICET) 5-325 MG tablet  Every 8 hours PRN     10/20/18 0132           Karrie Meres, PA-C 10/20/18 0132    Derwood Kaplan, MD 10/20/18 [phone]

## 2018-10-20 MED ORDER — OXYCODONE-ACETAMINOPHEN 5-325 MG PO TABS
1.0000 | ORAL_TABLET | Freq: Once | ORAL | Status: AC
  Administered 2018-10-20: 1 via ORAL
  Filled 2018-10-20: qty 1

## 2018-10-20 MED ORDER — OXYCODONE-ACETAMINOPHEN 5-325 MG PO TABS: 1.0000 | ORAL_TABLET | Freq: Three times a day (TID) | ORAL | 0 refills | Status: DC | PRN

## 2018-10-20 NOTE — Discharge Instructions (Addendum)
Prescription given for percocet. Take medication as directed and do not operate machinery, drive a car, or work while taking this medication as it can make you drowsy.   Call the hand surgeons office to make an appointment for follow up.  Please return to the emergency department for any new or worsening symptoms.

## 2018-10-20 NOTE — ED Notes (Signed)
Ortho contacted for splint 

## 2018-10-20 NOTE — Progress Notes (Signed)
Orthopedic Tech Progress Note Patient Details:  [name] [name] June 10, 1971 [id number]  Ortho Devices Type of Ortho Device: Arm sling, Sugartong splint Ortho Device/Splint Location: lue.  Ortho Device/Splint Interventions: Ordered, Application, Adjustment   Post Interventions Patient Tolerated: Well Instructions Provided: Care of device, Adjustment of device   Trinna Post 10/20/2018, 1:40 AM

## 2018-10-27 ENCOUNTER — Emergency Department (HOSPITAL_COMMUNITY)
Admission: EM | Admit: 2018-10-27 | Discharge: 2018-10-27 | Disposition: A | Discharge status: Home / Self Care | Payer: BLUE CROSS/BLUE SHIELD | Attending: Emergency Medicine | Admitting: Emergency Medicine

## 2018-10-27 ENCOUNTER — Encounter (HOSPITAL_COMMUNITY): Payer: Self-pay | Admitting: Emergency Medicine

## 2018-10-27 DIAGNOSIS — Z7982 Long term (current) use of aspirin: Secondary | ICD-10-CM | POA: Insufficient documentation

## 2018-10-27 DIAGNOSIS — I10 Essential (primary) hypertension: Secondary | ICD-10-CM | POA: Insufficient documentation

## 2018-10-27 DIAGNOSIS — M25532 Pain in left wrist: Secondary | ICD-10-CM | POA: Insufficient documentation

## 2018-10-27 DIAGNOSIS — I2510 Atherosclerotic heart disease of native coronary artery without angina pectoris: Secondary | ICD-10-CM | POA: Insufficient documentation

## 2018-10-27 DIAGNOSIS — Z760 Encounter for issue of repeat prescription: Secondary | ICD-10-CM | POA: Insufficient documentation

## 2018-10-27 DIAGNOSIS — Z79899 Other long term (current) drug therapy: Secondary | ICD-10-CM | POA: Insufficient documentation

## 2018-10-27 DIAGNOSIS — F17210 Nicotine dependence, cigarettes, uncomplicated: Secondary | ICD-10-CM | POA: Insufficient documentation

## 2018-10-27 MED ORDER — OXYCODONE-ACETAMINOPHEN 5-325 MG PO TABS: 1.0000 | ORAL_TABLET | Freq: Three times a day (TID) | ORAL | 0 refills | Status: DC | PRN

## 2018-10-27 MED ORDER — OXYCODONE-ACETAMINOPHEN 5-325 MG PO TABS
1.0000 | ORAL_TABLET | Freq: Once | ORAL | Status: AC
  Administered 2018-10-27: 1 via ORAL
  Filled 2018-10-27: qty 1

## 2018-10-27 NOTE — ED Triage Notes (Signed)
Pt reports he needs to be referred to another specialist for his left wrist injury because the other one won't take his insurance, also reports he needs a refill of his pain medication.

## 2018-10-27 NOTE — ED Provider Notes (Signed)
[facility] EMERGENCY DEPARTMENT Provider Note   CSN: [id number] Arrival date & time: 10/27/18  1056    History   Chief Complaint Chief Complaint  Patient presents with  . Wrist Pain    HPI [name] [name] is a 48 y.o. male.     HPI   Patient is a 48 year old male with history of hypertension, CAD, unstable angina, who presents emergency department today complaining of left wrist pain and requesting a medication refill.  Patient was seen by myself last week on 10/19/2018 and diagnosed with a distal radius fracture.  He was given Percocet for pain which he ran out of and is requesting a refill of.  He states he tried to contact the hand surgeon office and was told that the office will not accept his insurance.  He is requesting a new referral for hand surgery that accepts his insurance.  Denies any numbness and tingling to the fingers.  No significant swelling.  No reinjury.  Past Medical History:  Diagnosis Date  . Benign essential HTN   . CAD (coronary artery disease)   . Unstable angina [facility])     Patient Active Problem List   Diagnosis Date Noted  . HYPERTENSION, BENIGN ESSENTIAL 06/04/2007  . CORONARY ARTERY DISEASE 06/04/2007  . UNSTABLE ANGINA 05/16/2007    Past Surgical History:  Procedure Laterality Date  . CARDIAC CATHETERIZATION  05/15/07   with stenting  . cardiac stents          Home Medications    Prior to Admission medications   Medication Sig Start Date End Date Taking? Authorizing Provider  aspirin 325 MG tablet Take 325 mg by mouth daily.    [provider]  ibuprofen (ADVIL,MOTRIN) 800 MG tablet Take 1 tablet (800 mg total) by mouth every 8 (eight) hours as needed. 12/29/14   Charm Rings, MD  oxyCODONE-acetaminophen (PERCOCET/ROXICET) 5-325 MG tablet Take 1 tablet by mouth every 8 (eight) hours as needed for severe pain. 10/27/18   Malasia Torain S, PA-C  predniSONE (DELTASONE) 50 MG tablet Take 1 pill daily for 5 days.  12/29/14   Charm Rings, MD  traMADol (ULTRAM) 50 MG tablet Take 1 tablet (50 mg total) by mouth every 6 (six) hours as needed. 12/29/14   Charm Rings, MD    Family History History reviewed. No pertinent family history.  Social History Social History   Tobacco Use  . Smoking status: Current Every Day Smoker    Packs/day: 1.00    Types: Cigarettes  Substance Use Topics  . Alcohol use: Yes  . Drug use: Yes    Types: Marijuana    Comment: daily     Allergies   Patient has no known allergies.   Review of Systems Review of Systems  Constitutional: Negative for fever.  Musculoskeletal:       Left wrist pain  Neurological: Negative for numbness.     Physical Exam Updated Vital Signs BP (!) 145/97 (BP Location: Right Arm)   Pulse 79   Temp 98.3 F (36.8 C) (Oral)   Resp 16   SpO2 96%   Physical Exam Constitutional:      General: He is not in acute distress.    Appearance: He is well-developed.  Eyes:     Conjunctiva/sclera: Conjunctivae normal.  Cardiovascular:     Rate and Rhythm: Normal rate.  Pulmonary:     Effort: Pulmonary effort is normal.  Musculoskeletal:     Comments: Splint in  place to left upper extremity.  Able to move all fingers of the left hand.  Brisk cap refill to all angers of the left hand.  Normal sensation to all fingers.  Skin:    General: Skin is warm and dry.  Neurological:     Mental Status: He is alert and oriented to person, place, and time.      ED Treatments / Results  Labs (all labs ordered are listed, but only abnormal results are displayed) Labs Reviewed - No data to display  EKG None  Radiology No results found.  Procedures Procedures (including critical care time)  Medications Ordered in ED Medications  oxyCODONE-acetaminophen (PERCOCET/ROXICET) 5-325 MG per tablet 1 tablet (has no administration in time range)     Initial Impression / Assessment and Plan / ED Course  I have reviewed the triage vital signs  and the nursing notes.  Pertinent labs & imaging results that were available during my care of the patient were reviewed by me and considered in my medical decision making (see chart for details).      Final Clinical Impressions(s) / ED Diagnoses   Final diagnoses:  Left wrist pain  Medication refill   Patient presenting for continued left wrist pain.  He ran out of his pain medications after being diagnosed with a left radial fracture last week.  He is requesting a medication refill.  He is also requesting another referral to hand surgery as the hand surgeon he was referred to does not take his insurance.  No other complaints today.  Neurovascularly intact to the fingers of the left hand, doubt compartment syndrome or other abnormality.  Will give referral for hand surgery and refill his medications.  Advised return the ER for new or worsening symptoms.  He voiced understanding of plan reasons return.  Questions answered.  Patient wishes.  ED Discharge Orders         Ordered    oxyCODONE-acetaminophen (PERCOCET/ROXICET) 5-325 MG tablet  Every 8 hours PRN     10/27/18 1154           Rusell Meneely S, PA-C 10/27/18 1155    Loren Racer, MD 10/27/18 1212

## 2018-10-27 NOTE — Discharge Instructions (Addendum)
Prescription given for percocet. Take medication as directed and do not operate machinery, drive a car, or work while taking this medication as it can make you drowsy.   Call the hand surgery office today to schedule an appointment for follow up.  Please return to the emergency department for any new or worsening symptoms.

## 2018-10-27 NOTE — ED Notes (Addendum)
Discharge instructions and prescription discussed with Pt. Pt verbalized understanding. Pt stable and ambulatory.  Pt refused to sign signature pad.

## 2018-12-23 ENCOUNTER — Encounter: Payer: Self-pay | Admitting: *Deleted

## 2018-12-23 NOTE — Congregational Nurse Program (Signed)
Pt states he has had stints placed before.  States no regular follow up or regular pcp. Not sure if his insurance is active. He has [employer].  Also he has his B/P checked by EMT occasionally. Does not take any medication on regular basis for his b/p. Only medication that he occasionally takes is ASA 325 mg tab.

## 2018-12-30 NOTE — Progress Notes (Signed)
COVID Hotel Screening performed. Temperature, PHQ-9, and need for medical care and medications assessed. Patient reports that he takes an aspirin a day, but denies any health issues that require it.  Carlyle Basques RN MSN

## 2019-01-04 NOTE — Congregational Nurse Program (Signed)
Closing encounter per request. 

## 2019-01-06 NOTE — Progress Notes (Signed)
COVID Hotel Screening performed. Temperature, PHQ-9, and need for medical care and medications assessed. No additional needs assessed at this time.  Roseana Rhine RN MSN 

## 2019-01-13 NOTE — Progress Notes (Signed)
COVID Hotel Screening performed. Temperature, PHQ-9, and need for medical care and medications assessed. No additional needs assessed at this time.  Che Rachal RN MSN 

## 2019-02-04 ENCOUNTER — Other Ambulatory Visit (HOSPITAL_COMMUNITY): Payer: Self-pay

## 2019-02-04 DIAGNOSIS — Z20822 Contact with and (suspected) exposure to covid-19: Secondary | ICD-10-CM

## 2019-02-05 ENCOUNTER — Other Ambulatory Visit: Payer: Self-pay

## 2019-02-05 DIAGNOSIS — Z20822 Contact with and (suspected) exposure to covid-19: Secondary | ICD-10-CM

## 2019-02-08 LAB — NOVEL CORONAVIRUS, NAA: SARS-CoV-2, NAA: NOT DETECTED

## 2019-02-08 NOTE — Progress Notes (Signed)
COVID Hotel Screening performed. COVID screening, temperature, and need for medical care and medications assessed. Patient agreed to the COVID-19 testing. No additional needs assessed at this time.  Tanner Yeley RN MSN 

## 2019-02-14 NOTE — Progress Notes (Signed)
COVID Hotel Screening performed. COVID screening, temperature, PHQ-9, and need for medical care and medications assessed. No additional needs assessed at this time.  Abigale Dorow RN MSN 

## 2019-02-25 NOTE — Progress Notes (Signed)
COVID Hotel Screening performed. Temperature, PHQ-9, and need for medical care and medications assessed. No additional needs assessed at this time.  Justene Jensen  MSN, RN 

## 2019-03-11 ENCOUNTER — Other Ambulatory Visit: Payer: Self-pay | Admitting: *Deleted

## 2019-03-11 DIAGNOSIS — Z20822 Contact with and (suspected) exposure to covid-19: Secondary | ICD-10-CM

## 2019-03-18 LAB — NOVEL CORONAVIRUS, NAA: SARS-CoV-2, NAA: NOT DETECTED

## 2019-03-24 ENCOUNTER — Telehealth: Payer: Self-pay | Admitting: General Practice

## 2019-03-24 NOTE — Telephone Encounter (Signed)
Pt aware covid lab test negative, not detected °

## 2021-02-23 ENCOUNTER — Encounter (HOSPITAL_COMMUNITY): Payer: Self-pay | Admitting: Emergency Medicine

## 2021-02-23 ENCOUNTER — Ambulatory Visit (HOSPITAL_COMMUNITY)
Admission: EM | Admit: 2021-02-23 | Discharge: 2021-02-23 | Disposition: A | Discharge status: Home / Self Care | Payer: Self-pay | Attending: Medical Oncology | Admitting: Medical Oncology

## 2021-02-23 DIAGNOSIS — Z113 Encounter for screening for infections with a predominantly sexual mode of transmission: Secondary | ICD-10-CM | POA: Insufficient documentation

## 2021-02-23 LAB — HIV ANTIBODY (ROUTINE TESTING W REFLEX): HIV Screen 4th Generation wRfx: NONREACTIVE

## 2021-02-23 NOTE — ED Triage Notes (Signed)
Pt presents for STD testing. Unable to tell CMA if having any symptoms or exposure.

## 2021-02-23 NOTE — ED Provider Notes (Signed)
[facility]    CSN: [id number] Arrival date & time: 02/23/21  1350      History   Chief Complaint Chief Complaint  Patient presents with   STD Testing    HPI [name] [name] is a 50 y.o. male.   HPI   STI testing: Pt who appears inebriated presents for STI testing. Denies fevers, penile discharge, groin pain. He has not tried anything for symptoms.    Past Medical History:  Diagnosis Date   Benign essential HTN    CAD (coronary artery disease)    Unstable angina [facility])     Patient Active Problem List   Diagnosis Date Noted   HYPERTENSION, BENIGN ESSENTIAL 06/04/2007   CORONARY ARTERY DISEASE 06/04/2007   UNSTABLE ANGINA 05/16/2007    Past Surgical History:  Procedure Laterality Date   CARDIAC CATHETERIZATION  05/15/07   with stenting   cardiac stents         Home Medications    Prior to Admission medications   Medication Sig Start Date End Date Taking? Authorizing Provider  aspirin 325 MG tablet Take 325 mg by mouth daily.    [provider]  ibuprofen (ADVIL,MOTRIN) 800 MG tablet Take 1 tablet (800 mg total) by mouth every 8 (eight) hours as needed. 12/29/14   Charm Rings, MD  oxyCODONE-acetaminophen (PERCOCET/ROXICET) 5-325 MG tablet Take 1 tablet by mouth every 8 (eight) hours as needed for severe pain. 10/27/18   Couture, Cortni S, PA-C  predniSONE (DELTASONE) 50 MG tablet Take 1 pill daily for 5 days. 12/29/14   Charm Rings, MD  traMADol (ULTRAM) 50 MG tablet Take 1 tablet (50 mg total) by mouth every 6 (six) hours as needed. 12/29/14   Charm Rings, MD    Family History History reviewed. No pertinent family history.  Social History Social History   Tobacco Use   Smoking status: Every Day    Packs/day: 1.00    Pack years: 0.00    Types: Cigarettes  Substance Use Topics   Alcohol use: Yes   Drug use: Yes    Types: Marijuana    Comment: daily     Allergies   Patient has no known allergies.   Review of  Systems Review of Systems  As stated above in HPI Physical Exam Triage Vital Signs ED Triage Vitals  Enc Vitals Group     BP 02/23/21 1445 (!) 97/54     Pulse Rate 02/23/21 1445 81     Resp 02/23/21 1445 18     Temp 02/23/21 1445 99.7 F (37.6 C)     Temp Source 02/23/21 1445 Oral     SpO2 02/23/21 1445 97 %     Weight --      Height --      Head Circumference --      Peak Flow --      Pain Score 02/23/21 1443 0     Pain Loc --      Pain Edu? --      Excl. in GC? --    No data found.  Updated Vital Signs BP (!) 97/54 (BP Location: Left Arm)   Pulse 81   Temp 99.7 F (37.6 C) (Oral)   Resp 18   SpO2 97%   Physical Exam Vitals and nursing note reviewed.  Constitutional:      Appearance: Normal appearance.  Cardiovascular:     Rate and Rhythm: Normal rate and regular rhythm.     Heart  sounds: Normal heart sounds.  Neurological:     Mental Status: He is alert.     UC Treatments / Results  Labs (all labs ordered are listed, but only abnormal results are displayed) Labs Reviewed - No data to display  EKG   Radiology No results found.  Procedures Procedures (including critical care time)  Medications Ordered in UC Medications - No data to display  Initial Impression / Assessment and Plan / UC Course  I have reviewed the triage vital signs and the nursing notes.  Pertinent labs & imaging results that were available during my care of the patient were reviewed by me and considered in my medical decision making (see chart for details).     New.  Patient states that he is a bit dehydrated has had some alcohol today which is likely why his blood pressure is low.  He states that he is afraid of needles so likely has improved after his blood draw today and he declines recheck.  He states he is feeling well.  STI screening pending.  Safe sex brochure handed to patient. Final Clinical Impressions(s) / UC Diagnoses   Final diagnoses:  None   Discharge  Instructions   None    ED Prescriptions   None    PDMP not reviewed this encounter.   Rushie Chestnut, [place] 02/23/21 1506

## 2021-02-24 LAB — RPR: RPR Ser Ql: NONREACTIVE

## 2021-02-26 LAB — CYTOLOGY, (ORAL, ANAL, URETHRAL) ANCILLARY ONLY
Chlamydia: NEGATIVE
Comment: NEGATIVE
Comment: NEGATIVE
Comment: NORMAL
Neisseria Gonorrhea: NEGATIVE
Trichomonas: NEGATIVE

## 2022-09-19 ENCOUNTER — Other Ambulatory Visit (HOSPITAL_COMMUNITY)
Admission: EM | Admit: 2022-09-19 | Discharge: 2022-09-25 | Disposition: A | Discharge status: Home / Self Care | Payer: BLUE CROSS/BLUE SHIELD | Attending: Psychiatry | Admitting: Psychiatry

## 2022-09-19 DIAGNOSIS — F1910 Other psychoactive substance abuse, uncomplicated: Secondary | ICD-10-CM | POA: Diagnosis present

## 2022-09-19 DIAGNOSIS — Z1152 Encounter for screening for COVID-19: Secondary | ICD-10-CM | POA: Diagnosis not present

## 2022-09-19 LAB — CBC WITH DIFFERENTIAL/PLATELET
Abs Immature Granulocytes: 0.07 10*3/uL (ref 0.00–0.07)
Basophils Absolute: 0.1 10*3/uL (ref 0.0–0.1)
Basophils Relative: 0 %
Eosinophils Absolute: 0 10*3/uL (ref 0.0–0.5)
Eosinophils Relative: 0 %
HCT: 30 % — ABNORMAL LOW (ref 39.0–52.0)
Hemoglobin: 8.9 g/dL — ABNORMAL LOW (ref 13.0–17.0)
Immature Granulocytes: 0 %
Lymphocytes Relative: 10 %
Lymphs Abs: 1.7 10*3/uL (ref 0.7–4.0)
MCH: 22.4 pg — ABNORMAL LOW (ref 26.0–34.0)
MCHC: 29.7 g/dL — ABNORMAL LOW (ref 30.0–36.0)
MCV: 75.4 fL — ABNORMAL LOW (ref 80.0–100.0)
Monocytes Absolute: 2.7 10*3/uL — ABNORMAL HIGH (ref 0.1–1.0)
Monocytes Relative: 15 %
Neutro Abs: 13.2 10*3/uL — ABNORMAL HIGH (ref 1.7–7.7)
Neutrophils Relative %: 75 %
Platelets: 350 10*3/uL (ref 150–400)
RBC: 3.98 MIL/uL — ABNORMAL LOW (ref 4.22–5.81)
RDW: 17 % — ABNORMAL HIGH (ref 11.5–15.5)
WBC: 17.8 10*3/uL — ABNORMAL HIGH (ref 4.0–10.5)
nRBC: 0 % (ref 0.0–0.2)

## 2022-09-19 LAB — POCT URINE DRUG SCREEN - MANUAL ENTRY (I-SCREEN)
POC Amphetamine UR: NOT DETECTED
POC Buprenorphine (BUP): NOT DETECTED
POC Cocaine UR: NOT DETECTED
POC Marijuana UR: POSITIVE — AB
POC Methadone UR: NOT DETECTED
POC Methamphetamine UR: NOT DETECTED
POC Morphine: NOT DETECTED
POC Oxazepam (BZO): NOT DETECTED
POC Oxycodone UR: NOT DETECTED
POC Secobarbital (BAR): NOT DETECTED

## 2022-09-19 LAB — COMPREHENSIVE METABOLIC PANEL
ALT: 21 U/L (ref 0–44)
AST: 25 U/L (ref 15–41)
Albumin: 3.8 g/dL (ref 3.5–5.0)
Alkaline Phosphatase: 48 U/L (ref 38–126)
Anion gap: 14 (ref 5–15)
BUN: 10 mg/dL (ref 6–20)
CO2: 25 mmol/L (ref 22–32)
Calcium: 9.2 mg/dL (ref 8.9–10.3)
Chloride: 97 mmol/L — ABNORMAL LOW (ref 98–111)
Creatinine, Ser: 1 mg/dL (ref 0.61–1.24)
GFR, Estimated: >60 mL/min (ref >60)
Glucose, Bld: 94 mg/dL (ref 70–99)
Potassium: 3.9 mmol/L (ref 3.5–5.1)
Sodium: 136 mmol/L (ref 135–145)
Total Bilirubin: 0.5 mg/dL (ref 0.3–1.2)
Total Protein: 7.9 g/dL (ref 6.5–8.1)

## 2022-09-19 LAB — LIPID PANEL
Cholesterol: 123 mg/dL (ref 0–200)
HDL: 42 mg/dL (ref >40)
LDL Cholesterol: 67 mg/dL (ref 0–99)
Total CHOL/HDL Ratio: 2.9 RATIO
Triglycerides: 68 mg/dL (ref <150)
VLDL: 14 mg/dL (ref 0–40)

## 2022-09-19 LAB — RESP PANEL BY RT-PCR (RSV, FLU A&B, COVID)  RVPGX2
Influenza A by PCR: NEGATIVE
Influenza B by PCR: NEGATIVE
Resp Syncytial Virus by PCR: NEGATIVE
SARS Coronavirus 2 by RT PCR: NEGATIVE

## 2022-09-19 LAB — HEMOGLOBIN A1C
Hgb A1c MFr Bld: 5.2 % (ref 4.8–5.6)
Mean Plasma Glucose: 102.54 mg/dL

## 2022-09-19 LAB — ETHANOL: Alcohol, Ethyl (B): 54 mg/dL — ABNORMAL HIGH (ref <10)

## 2022-09-19 MED ORDER — THIAMINE HCL 100 MG/ML IJ SOLN
100.0000 mg | Freq: Once | INTRAMUSCULAR | Status: DC
  Filled 2022-09-19: qty 2

## 2022-09-19 MED ORDER — HYDROXYZINE HCL 25 MG PO TABS: 25.0000 mg | ORAL_TABLET | Freq: Four times a day (QID) | ORAL | Status: AC | PRN

## 2022-09-19 MED ORDER — ASPIRIN 325 MG PO TABS
325.0000 mg | ORAL_TABLET | Freq: Every day | ORAL | Status: DC
  Administered 2022-09-19 – 2022-09-24 (×6): 325 mg via ORAL
  Filled 2022-09-19 (×2): qty 1
  Filled 2022-09-19: qty 14
  Filled 2022-09-19 (×4): qty 1

## 2022-09-19 MED ORDER — ADULT MULTIVITAMIN W/MINERALS CH
1.0000 | ORAL_TABLET | Freq: Every day | ORAL | Status: DC
  Administered 2022-09-19 – 2022-09-24 (×6): 1 via ORAL
  Filled 2022-09-19 (×6): qty 1

## 2022-09-19 MED ORDER — ALUM & MAG HYDROXIDE-SIMETH 200-200-20 MG/5ML PO SUSP: 30.0000 mL | ORAL | Status: DC | PRN

## 2022-09-19 MED ORDER — LORAZEPAM 1 MG PO TABS: 1.0000 mg | ORAL_TABLET | Freq: Four times a day (QID) | ORAL | Status: AC | PRN

## 2022-09-19 MED ORDER — LOPERAMIDE HCL 2 MG PO CAPS: 2.0000 mg | ORAL_CAPSULE | ORAL | Status: AC | PRN

## 2022-09-19 MED ORDER — ONDANSETRON 4 MG PO TBDP: 4.0000 mg | ORAL_TABLET | Freq: Four times a day (QID) | ORAL | Status: AC | PRN

## 2022-09-19 MED ORDER — MAGNESIUM HYDROXIDE 400 MG/5ML PO SUSP: 30.0000 mL | Freq: Every day | ORAL | Status: DC | PRN

## 2022-09-19 MED ORDER — THIAMINE MONONITRATE 100 MG PO TABS
100.0000 mg | ORAL_TABLET | Freq: Every day | ORAL | Status: DC
  Administered 2022-09-20 – 2022-09-24 (×5): 100 mg via ORAL
  Filled 2022-09-19 (×5): qty 1

## 2022-09-19 MED ORDER — ACETAMINOPHEN 325 MG PO TABS: 650.0000 mg | ORAL_TABLET | Freq: Four times a day (QID) | ORAL | Status: DC | PRN

## 2022-09-19 NOTE — ED Notes (Signed)
Pt is in the bed sleeping. Respirations are even and unlabored. No acute distress noted. Will continue to monitor for safety. 

## 2022-09-19 NOTE — ED Provider Notes (Signed)
Facility Based Crisis Admission H&P  Date: 09/19/22 Patient Name: [name] [name] MRN: [id number] Chief Complaint: "[facility]" Chief Complaint  Patient presents with   Addiction Problem     Diagnoses:  Final diagnoses:  Polysubstance abuse [facility])   HPI: Pt presents voluntarily to [facility] behavioral health for walk-in assessment. Pt is assessed face-to-face by nurse practitioner.   [name] [name], 52 y.o., male patient seen face to face by this provider, consulted with Dr. Lucianne Muss; and chart reviewed on 09/19/22.  On evaluation, when asked reason for presenting today, [name] [name] reports "[facility]". Pt reports he was at [facility] earlier today and was told to come to this facility first. Pt states he was prescribed a medication that starts with a letter "s" to help with sleep, and he has not taken it in several months. When he told [facility] this, they asked him to get evaluated to see if he needed the medication.   Called [facility] at [phone] and was informed that pt will require detox prior to admission.   Pt reports use of alcohol daily, 12 pack beer/day, reports last use was yesterday. Pt reports use of marijuana daily, 6 to 7 blunts/day, reports last use was yesterday. Pt reports use of 1.5 pack cigarettes/day, reports last use today. Pt denies history of methamphetamine, opioids, other substances.  Pt denies history of delirium tremens or withdrawal seizures.   Pt endorses euthymic mood. He reports good appetite, eating 3 meals/day, and sleep, sleeping 10 hours/night.  Pt denies suicidal, homicidal or violent ideations. He denies auditory visual hallucinations or paranoia.  Pt denies history of suicide attempt, non suicidal self injurious behavior, or inpatient psychiatric hospitalization.  Pt denies he is receiving counseling or psychiatric medication management. He states only medication he is taking is aspirin 325mg  a day. He last took aspirin yesterday.   Pt reports he is  unemployed.  Pt is living with a friend.  Pt reports highest level of education is 12th grade.  Pt denies knowledge of family psychiatric history.  Pt denies access to a firearm or other weapon.  Pt reports he has a charge for DWI. He reports court date was yesterday but is going to be moved. He does not know the date it will be moved to.  Pt to be admitted to facility based crisis for further crisis stabilization. Pt is interested in residential substance use treatment at [facility].  PHQ 2-9:  Flowsheet Row Documentation from 02/25/2019 in [facility] Documentation from 02/11/2019 in [facility] Documentation from 01/13/2019 in PATIENT [facility]  Thoughts that you would be better off dead, or of hurting yourself in some way Not at all Not at all Not at all  PHQ-9 Total Score 0 0 0       Flowsheet Row ED from 09/19/2022 in [facility] ED from 02/23/2021 in [facility] at [facility] RISK CATEGORY No Risk Error: Question 6 not populated        Total Time spent with patient: 45 minutes  Musculoskeletal  Strength & Muscle Tone: within normal limits Gait & Station: normal Patient leans: N/A  Psychiatric Specialty Exam  Presentation General Appearance:  Appropriate for Environment; Casual  Eye Contact: Fair  Speech: Clear and Coherent; Normal Rate  Speech Volume: Normal  Handedness: Right   Mood and Affect  Mood: Euthymic  Affect: Blunt   Thought Process  Thought Processes: Coherent; Goal Directed; Linear  Descriptions of Associations:Intact  Orientation:Full (  Time, Place and Person)  Thought Content:Logical  Diagnosis of Schizophrenia or Schizoaffective disorder in past: No   Hallucinations:Hallucinations: None  Ideas of Reference:None  Suicidal Thoughts:Suicidal Thoughts: No  Homicidal Thoughts:Homicidal Thoughts: No   Sensorium  Memory: Immediate Good; Recent  Fair; Remote Fair  Judgment: Fair  Insight: Fair   Materials engineer: Fair  Attention Span: Fair  Recall: [employer] of Knowledge: Fair  Language: Fair   Psychomotor Activity  Psychomotor Activity: Psychomotor Activity: Normal   Assets  Assets: Communication Skills; Desire for Improvement; Leisure Time; Resilience; Social Support   Sleep  Sleep: Sleep: Good Number of Hours of Sleep: 10   Nutritional Assessment (For OBS and [facility]BC admissions only) Has the patient had a weight loss or gain of 10 pounds or more in the last 3 months?: No Has the patient had a decrease in food intake/or appetite?: No Does the patient have dental problems?: No Does the patient have eating habits or behaviors that may be indicators of an eating disorder including binging or inducing vomiting?: No Has the patient recently lost weight without trying?: 0 Has the patient been eating poorly because of a decreased appetite?: 0 Malnutrition Screening Tool Score: 0    Physical Exam Constitutional:      General: He is not in acute distress.    Appearance: Normal appearance. He is not ill-appearing, toxic-appearing or diaphoretic.  Eyes:     General: No scleral icterus. Cardiovascular:     Rate and Rhythm: Normal rate.  Pulmonary:     Effort: Pulmonary effort is normal. No respiratory distress.  Neurological:     Mental Status: He is alert and oriented to person, place, and time.  Psychiatric:        Attention and Perception: Attention and perception normal.        Mood and Affect: Mood normal. Affect is blunt.        Speech: Speech normal.        Behavior: Behavior normal. Behavior is cooperative.        Thought Content: Thought content normal.        Cognition and Memory: Cognition and memory normal.        Judgment: Judgment normal.    Review of Systems  Constitutional:  Negative for chills and fever.  Respiratory:  Negative for shortness of breath.    Cardiovascular:  Negative for chest pain and palpitations.  Gastrointestinal:  Negative for abdominal pain.  Neurological:  Negative for headaches.  Psychiatric/Behavioral:  Positive for substance abuse.     Blood pressure 132/67, pulse 100, temperature 99.8 F (37.7 C), temperature source Oral, resp. rate 18, height 5\' 6"  (1.676 m), weight 170 lb (77.1 kg), SpO2 95 %. Body mass index is 27.44 kg/m.  Past Psychiatric History: Alcohol abuse   Is the patient at risk to self? No  Has the patient been a risk to self in the past 6 months? No .    Has the patient been a risk to self within the distant past? No   Is the patient a risk to others? No   Has the patient been a risk to others in the past 6 months? No   Has the patient been a risk to others within the distant past? No   Past Medical History:  Past Medical History:  Diagnosis Date   Benign essential HTN    CAD (coronary artery disease)    Unstable angina ([place])     Past Surgical History:  Procedure Laterality Date   CARDIAC CATHETERIZATION  05/15/07   with stenting   cardiac stents      Family History: No family history on file.  Social History:  Social History   Socioeconomic History   Marital status: Married    Spouse name: Not on file   Number of children: Not on file   Years of education: Not on file   Highest education level: Not on file  Occupational History   Not on file  Tobacco Use   Smoking status: Every Day    Packs/day: 1.00    Types: Cigarettes   Smokeless tobacco: Not on file  Substance and Sexual Activity   Alcohol use: Yes   Drug use: Yes    Types: Marijuana    Comment: daily   Sexual activity: Not on file  Other Topics Concern   Not on file  Social History Narrative   Not on file   Social Determinants of Health   Financial Resource Strain: Not on file  Food Insecurity: Not on file  Transportation Needs: Not on file  Physical Activity: Not on file  Stress: Not on file  Social  Connections: Not on file  Intimate Partner Violence: Not on file    SDOH:  SDOH Screenings   Depression (PHQ2-9): Low Risk  (09/19/2022)  Tobacco Use: High Risk (02/23/2021)    Last Labs:  No visits with results within 6 Month(s) from this visit.  Latest known visit with results is:  Admission on 02/23/2021, Discharged on 02/23/2021  Component Date Value Ref Range Status   Neisseria Gonorrhea 02/23/2021 Negative   Final   Chlamydia 02/23/2021 Negative   Final   Trichomonas 02/23/2021 Negative   Final   Comment 02/23/2021 Normal Reference Range Trichomonas - Negative   Final   Comment 02/23/2021 Normal Reference Ranger Chlamydia - Negative   Final   Comment 02/23/2021 Normal Reference Range Neisseria Gonorrhea - Negative   Final   HIV Screen 4th Generation wRfx 02/23/2021 Non Reactive  Non Reactive Final   Performed at [facility], [address], [place], [place] [address]   RPR Ser Ql 02/23/2021 NON REACTIVE  NON REACTIVE Final   Performed at [facility], [address], [place], [place] [address]    Allergies: Patient has no known allergies.  PTA Medications: (Not in a hospital admission)   Long Term Goals: Improvement in symptoms so as ready for discharge  Short Term Goals: Patient will verbalize feelings in meetings with treatment team members., Patient will attend at least of 50% of the groups daily., and Pt will complete the PHQ9 on admission, day 3 and discharge.  Medical Decision Making  [facility] admission  Lab Orders         Resp panel by RT-PCR (RSV, Flu A&B, Covid) Anterior Nasal Swab         CBC with Differential/Platelet         Comprehensive metabolic panel         Ethanol         Hemoglobin A1c         Lipid panel         POCT Urine Drug Screen - (I-Screen)     Meds ordered this encounter  Medications   acetaminophen (TYLENOL) tablet 650 mg   alum & mag hydroxide-simeth (MAALOX/MYLANTA) 200-200-20 MG/5ML suspension 30 mL   magnesium hydroxide  (MILK OF MAGNESIA) suspension 30 mL   thiamine (VITAMIN B1) injection 100 mg  thiamine (VITAMIN B1) tablet 100 mg   multivitamin with minerals tablet 1 tablet   LORazepam (ATIVAN) tablet 1 mg   hydrOXYzine (ATARAX) tablet 25 mg   loperamide (IMODIUM) capsule 2-4 mg   ondansetron (ZOFRAN-ODT) disintegrating tablet 4 mg   aspirin tablet 325 mg   Recommendations  Based on my evaluation the patient does not appear to have an emergency medical condition.  Tharon Aquas, NP 09/19/22  11:55 AM

## 2022-09-19 NOTE — ED Notes (Signed)
Pt arrived [facility] Unit 

## 2022-09-19 NOTE — ED Notes (Signed)
Patient resting quietly in bed with eyes closed. Respirations equal and unlabored, skin warm and dry, NAD. Routine safety checks conducted according to facility protocol. Will continue to monitor for safety.  

## 2022-09-19 NOTE — ED Notes (Signed)
Pt sleeping@this  time. Breathing even and unlabored. Pt has no c/o pain or distress. Will continue to monitor for safety

## 2022-09-19 NOTE — ED Notes (Signed)
Patient admitted to [facility] from [facility] for detox from etoh.  Patient calm and cooperative with care and admission process. He was oriented to unit and shown to his room.  He denied avh shi or plan.  NO distress and no current evidence of etoh withdrawal.  Will monitor and provide a safe environment.

## 2022-09-19 NOTE — ED Provider Notes (Signed)
Blood pressure 132/67, pulse 100, temperature 99.8 F (37.7 C), temperature source Oral, resp. rate 18, height 5\' 6"  (1.676 m), weight 77.1 kg, SpO2 95 %.  In short, [name] [name] is a 52 y.o. male with a chief complaint of Addiction Problem .  Refer to the original H&P for additional details.  04:00 PM Spoke with the behavioral health team regarding CBC results from today.  Patient has leukocytosis as well as anemia but no symptoms of of infection or symptomatic anemia.  No prior values in the chart for comparison and trending.  Patient does take aspirin but is not anticoagulated.  Advised that with largely unremarkable vital signs and no symptoms I would not take emergent action on these lab values.  If he develops fever or other infection symptoms we could evaluate further or if he develops symptoms of symptomatic anemia or displays evidence of active bleeding he could certainly be seen in the emergency department.  Would make him aware at discharge that he does have some anemia noted on lab work and he can follow with the primary care physician if he remains asymptomatic.    Margette Fast, MD 09/19/22 [phone]

## 2022-09-19 NOTE — ED Notes (Signed)
Patient A&Ox4. Patient presents to [facility] for substance abuse. Patient denies SI/HI and AVH. Patient denies any physical complaints when asked. No acute distress noted. Support and encouragement provided. Routine safety checks conducted according to facility protocol. Encouraged patient to notify staff if thoughts of harm toward self or others arise. Patient verbalize understanding and agreement. Will continue to monitor for safety.

## 2022-09-19 NOTE — ED Notes (Signed)
The pt was given a snack.

## 2022-09-19 NOTE — BH Assessment (Addendum)
Comprehensive Clinical Assessment (CCA) Note  09/19/2022 [name] [name] [id number]  DISPOSITION: Per Elvin So NP, pt is recommended for treatment at [facility].   The patient demonstrates the following risk factors for suicide: Chronic risk factors for suicide include: substance use disorder. Acute risk factors for suicide include: family or marital conflict, unemployment, and loss (financial, interpersonal, professional). Protective factors for this patient include: positive social support and hope for the future. Considering these factors, the overall suicide risk at this point appears to be moderate. Patient is appropriate for outpatient follow up.   Pt is a 52 yo male who presented voluntarily and unaccompanied seeking clearance to go to [facility] for treatment. Pt stated that [facility] sent him to [facility] "to get checked out." Pt mentioned "some drug" he was prescribed whe he went through detox at [facility] recently (a few weeks ago estimation by pt). Pt denied si, HI, NSSH, AVH and paranoia. Pt stated he drinks alcohol and smokes cannabis daily. Pt stated his beggest stressor is that both his 2 children (2 yo daughter and 55 yo son) were shot and killed around Thanksgiving 2023. Pt stated "all I want is justice."   Pt stated he is "staying with a friend" currently. Pt stated that his focus is "seeking justice for my children." Pt stated that he is currently unemployed and separated from his wife. Pt stated that he currently has pending DWI charges against him.    Pt was calm, cooperative, alert and appeared oriented. Pt did not appear to be responding to internal stimuli, experiencing delusional thinking or to be intoxicated. Pt's speech and movement appeared within normal limits and appearance was unremarkable. Pt's mood seemed solemn and somewhat depressed, and pt had a flat affect which was congruent. Pt's judgment and insight seemed within normal limits.    Chief Complaint:  Chief  Complaint  Patient presents with   Addiction Problem   Visit Diagnosis:  Alcohol Dependence Cannabis Dependence MDD    CCA Screening, Triage and Referral (STR)  Patient Reported Information How did you hear about [place]? Other (Comment) ([facility])  What Is the Reason for Your Visit/Call Today? Pt is a 52 yo male who presented voluntarily and unaccompanied seeking clearance to go to [facility] for treatment. Pt stated that [facility] sent him to [facility] "to get checked out." Pt mentioned  "some drug" he was prescribed whe he went through detox at [facility] recently (a few weeks ago estimation by pt). Pt denied si, HI, NSSH, AVH and paranoia. Pt stated he drinks alcohol and smokes cannabis daily. Pt stated his beggest stressor is that both his 2 children (67 yo daughter and 80 yo son) were shot and killed around Thanksgiving 2023. Pt stated "all I want is justice."  How Long Has This Been Causing You Problems? > than 6 months  What Do You Feel Would Help You the Most Today? Alcohol or Drug Use Treatment   Have You Recently Had Any Thoughts About Hurting Yourself? No  Are You Planning to Commit Suicide/Harm Yourself At This time? No   Flowsheet Row ED from 09/19/2022 in [facility] ED from 02/23/2021 in [facility] [place] No Risk Error: Question 6 not populated       Have you Recently Had Thoughts About [place]? No  Are You Planning to Harm Someone at This Time? No  Explanation: No data recorded  Have You Used Any Alcohol or Drugs in the  Past 24 Hours? Yes  What Did You Use and How Much? known amounts   Do You Currently Have a Therapist/Psychiatrist? No  Name of Therapist/Psychiatrist: Name of Therapist/Psychiatrist: na   Have You Been Recently Discharged From Any Office Practice or Programs? No  Explanation of Discharge From Practice/Program: na     CCA Screening Triage Referral Assessment Type  of Contact: Face-to-Face  Telemedicine Service Delivery:   Is this Initial or Reassessment?   Date Telepsych consult ordered in [facility]:    Time Telepsych consult ordered in [facility]:    Location of Assessment: [facility] Assessment Services  Provider Location: [facility] Assessment Services   Collateral Involvement: none   Does Patient Have a New [place]? No  Legal Guardian Contact Information: na  Copy of Legal Guardianship Form: No - copy requested (na)  Legal Guardian Notified of Arrival: No data recorded Legal Guardian Notified of Pending Discharge: No data recorded If Minor and Not Living with Parent(s), Who has Custody? na  Is CPS involved or ever been involved? Never (nonr reported)  Is APS involved or ever been involved? Never (none reported)   Patient Determined To Be At Risk for Harm To Self or Others Based on Review of Patient Reported Information or Presenting Complaint? No  Method: No Plan  Availability of Means: No access or NA  Intent: Vague intent or NA  Notification Required: No need or identified person  Additional Information for Danger to Others Potential: No data recorded Additional Comments for Danger to Others Potential: none  Are There Guns or Other Weapons in [place]? No (denied)  Types of Guns/Weapons: na  Are These Weapons Safely Secured?                            -- (na)  Who Could Verify You Are Able To Have These Secured: na  Do You Have any Outstanding Charges, Pending Court Dates, Parole/Probation? pending charges for DWI  Contacted To Inform of Risk of Harm To Self or Others: -- (na)    Does Patient Present under Involuntary Commitment? No    [place] of Residence: [place]   Patient Currently Receiving the Following Services: Not Receiving Services   Determination of Need: Urgent (48 hours)   Options For Referral: Facility-Based Crisis     CCA Biopsychosocial Patient Reported Schizophrenia/Schizoaffective  Diagnosis in Past: No   Strengths: resilience   Mental Health Symptoms Depression:   Change in energy/activity; Difficulty Concentrating; Irritability; Tearfulness   Duration of Depressive symptoms:  Duration of Depressive Symptoms: Greater than two weeks   Mania:   Racing thoughts   Anxiety:    Restlessness; Worrying; Irritability   Psychosis:   None   Duration of Psychotic symptoms:    Trauma:   Avoids reminders of event; Emotional numbing; Irritability/anger   Obsessions:   None   Compulsions:   None   Inattention:   N/A   Hyperactivity/Impulsivity:   N/A   Oppositional/Defiant Behaviors:   N/A   Emotional Irregularity:   Mood lability   Other Mood/Personality Symptoms:   none observed    Mental Status Exam Appearance and self-care  Stature:   Average   Weight:   Overweight   Clothing:   Casual   Grooming:   Normal   Cosmetic use:   None   Posture/gait:   Normal   Motor activity:   Not Remarkable   Sensorium  Attention:   Normal  Concentration:   Normal   Orientation:   X5; Time; Situation; Place; Person; Object   Recall/memory:   Normal   Affect and Mood  Affect:   Depressed   Mood:   Depressed   Relating  Eye contact:   Normal   Facial expression:   Constricted   Attitude toward examiner:   Cooperative   Thought and Language  Speech flow:  Clear and Coherent   Thought content:   Appropriate to Mood and Circumstances   Preoccupation:   Other (Comment) (Death of his 2 children in 12-13-2021)   Hallucinations:   None   Organization:   Coherent   [employer] of Knowledge:   Average   Intelligence:   Average   Abstraction:   Functional   Judgement:   Poor   Reality Testing:   Adequate   Insight:   Fair   Decision Making:   Vacilates   Social Functioning  Social Maturity:   Impulsive   Social Judgement:   Heedless   Stress  Stressors:   Grief/losses   Coping  Ability:   Deficient supports   Skill Deficits:   Self-control; Decision making   Supports:   Family     Religion: Religion/Spirituality Are You A Religious Person?: Yes How Might This Affect Treatment?: na  Leisure/Recreation: Leisure / Recreation Do You Have Hobbies?: No  Exercise/Diet: Exercise/Diet Do You Exercise?: Yes What Type of Exercise Do You Do?: Run/Walk How Many Times a Week Do You Exercise?: 4-5 times a week Have You Gained or Lost A Significant Amount of Weight in the Past Six Months?: No Do You Follow a Special Diet?: No Do You Have Any Trouble Sleeping?: No   CCA Employment/Education Employment/Work Situation: Employment / Work Situation Employment Situation: Unemployed Patient's Job has Been Impacted by Current Illness: No Has Patient ever Been in Passenger transport manager?: No  Education: Education Is Patient Currently Attending School?: No Last Grade Completed: 12 Did You Nutritional therapist?: No Did You Have An Individualized Education Program (IIEP): No Did You Have Any Difficulty At [employer]?: No Patient's Education Has Been Impacted by Current Illness: No   CCA Family/Childhood History Family and Relationship History: Family history Marital status: Separated Separated, when?: unknown What types of issues is patient dealing with in the relationship?: unknown Additional relationship information: na Does patient have children?: No (Both his children were killed in 13-Dec-2021 per pt)  Childhood History:  Childhood History By whom was/is the patient raised?: Both parents Did patient suffer any verbal/emotional/physical/sexual abuse as a child?: No Did patient suffer from severe childhood neglect?: No Has patient ever been sexually abused/assaulted/raped as an adolescent or adult?: No Was the patient ever a victim of a crime or a disaster?: No Witnessed domestic violence?: No Has patient been affected by domestic violence as an adult?: No       CCA  Substance Use Alcohol/Drug Use: Alcohol / Drug Use Pain Medications: see MAR Prescriptions: see MAR Over the Counter: see MAR History of alcohol / drug use?: Yes Longest period of sobriety (when/how long): unknown Negative Consequences of Use: Legal (DWI) Withdrawal Symptoms: None (None reported) Substance #1 Name of Substance 1: alcohol 1 - Age of First Use: teen 1 - Amount (size/oz): varies 1 - Frequency: daily 1 - Duration: ongoing 1 - Last Use / Amount: yesterday 1 - Method of Aquiring: purchase 1- Route of Use: drink/oral Substance #2 Name of Substance 2: cannabis 2 - Age of First Use: 22 2 -  Amount (size/oz): varies 2 - Frequency: daily 2 - Duration: ongoing 2 - Last Use / Amount: yesterday 2 - Method of Aquiring: unknown 2 - Route of Substance Use: smoke                     ASAM's:  Six Dimensions of Multidimensional Assessment  Dimension 1:  Acute Intoxication and/or Withdrawal Potential:   Dimension 1:  Description of individual's past and current experiences of substance use and withdrawal: none reported  Dimension 2:  Biomedical Conditions and Complications:   Dimension 2:  Description of patient's biomedical conditions and  complications: none reported  Dimension 3:  Emotional, Behavioral, or Cognitive Conditions and Complications:  Dimension 3:  Description of emotional, behavioral, or cognitive conditions and complications: MDD and grief  Dimension 4:  Readiness to Change:  Dimension 4:  Description of Readiness to Change criteria: Stated is ready to change but still using  Dimension 5:  Relapse, Continued use, or Continued Problem Potential:  Dimension 5:  Relapse, continued use, or continued problem potential critiera description: continued use  Dimension 6:  Recovery/Living Environment:  Dimension 6:  Recovery/Iiving environment criteria description: same environment  ASAM Severity Score: ASAM's Severity Rating Score: 8  ASAM Recommended Level of  Treatment: ASAM Recommended Level of Treatment: Level I Outpatient Treatment   Substance use Disorder (SUD) Substance Use Disorder (SUD)  Checklist Symptoms of Substance Use: Continued use despite persistent or recurrent social, interpersonal problems, caused or exacerbated by use, Presence of craving or strong urge to use  Recommendations for Services/Supports/Treatments: Recommendations for Services/Supports/Treatments Recommendations For Services/Supports/Treatments: Individual Therapy  Discharge Disposition:    DSM5 Diagnoses: Patient Active Problem List   Diagnosis Date Noted   HYPERTENSION, BENIGN ESSENTIAL 06/04/2007   CORONARY ARTERY DISEASE 06/04/2007   UNSTABLE ANGINA 05/16/2007     Referrals to Alternative Service(s): Referred to Alternative Service(s):   Place:   Date:   Time:    Referred to Alternative Service(s):   Place:   Date:   Time:    Referred to Alternative Service(s):   Place:   Date:   Time:    Referred to Alternative Service(s):   Place:   Date:   Time:     Carolanne Grumbling, Counselor  Corrie Dandy T. Jimmye Norman, MS, [facility], [facility] Triage Specialist [facility]

## 2022-09-19 NOTE — Progress Notes (Signed)
   09/19/22 1001  [place] (Walk-ins at [facility] only)  How Did You Hear About [place]? Other (Comment) ([facility])  What Is the Reason for Your Visit/Call Today? Pt is a 52 yo male who presented voluntarily and unaccompanied seeking clearance to go to [facility] for treatment. Pt stated that [facility] sent him to [facility] "to get checked out." Pt mentioned  "some drug" he was prescribed whe he went through detox at [facility] recently (a few weeks ago estimation by pt). Pt denied si, HI, NSSH, AVH and paranoia. Pt stated he drinks alcohol and smokes cannabis daily. Pt stated his beggest stressor is that both his 2 children (20 yo daughter and 54 yo son) were shot and killed around Thanksgiving 2023. Pt stated "all I want is justice."  How Long Has This Been Causing You Problems? > than 6 months  Have You Recently Had Any Thoughts About Hurting Yourself? No  Are You Planning to Commit Suicide/Harm Yourself At This time? No  Have you Recently Had Thoughts About [place]? No  Are You Planning To Harm Someone At This Time? No  Are you currently experiencing any auditory, visual or other hallucinations? No  Have You Used Any Alcohol or Drugs in the Past 24 Hours? Yes  How long ago did you use Drugs or Alcohol? alcohol and cannabis yesterday  What Did You Use and How Much? known amounts  Do you have any current medical co-morbidities that require immediate attention? No  Clinician description of patient physical appearance/behavior: Pt was calm, cooperative, alert and appeared oriented. Pt did not appear to be responding to internal stimuli, experiencing delusional thinking or to be intoxicated.  Pt's speech and movement appeared within normal limits and appearance was unremarkable. Pt's mood seemed solemn and somewhat depressed, and pt had a flat affect which was congruent. Pt's judgment and insight seemed within normal limits.  What Do You Feel Would Help You the Most Today? Alcohol or Drug Use  Treatment  If access to [facility] Urgent [facility] was not available, would you have sought care in the Emergency Department? Yes  Determination of Need Urgent (48 hours)  Options For Referral Facility-Based Crisis   Maguire Sime T. Mare Ferrari, [place], [facility], [facility] Triage Specialist [employer]

## 2022-09-20 DIAGNOSIS — F1910 Other psychoactive substance abuse, uncomplicated: Secondary | ICD-10-CM | POA: Diagnosis not present

## 2022-09-20 MED ORDER — MIRTAZAPINE 7.5 MG PO TABS
7.5000 mg | ORAL_TABLET | Freq: Every day | ORAL | Status: DC
  Administered 2022-09-20: 7.5 mg via ORAL
  Filled 2022-09-20: qty 1

## 2022-09-20 NOTE — ED Notes (Signed)
Pt sleeping@this time. Breathing even and unlabored. Will continue to monitor for safety 

## 2022-09-20 NOTE — ED Notes (Signed)
Observed what appeared to be a string on patients jeans when admin morning meds. After completing med admin, the RN and this nurse assessed what patient was using to keep his jeans up. Found a thick, small rope in the front of his jeans near the zipper. Informed patient that we would need to remove the rope, place it in his locker and give him a fall arm band to use in place of the rope. Informed Dr. Alvie Heidelberg also. Safety maintained and will continue to monitor.

## 2022-09-20 NOTE — Tx Team (Addendum)
LCSW attempted to speak with patient on this morning regarding reason for admission and patient reported he would like to speak at a later time. Per chart review, "Pt is a 52 yo male who presented voluntarily and unaccompanied seeking clearance to go to [facility] for treatment. Pt stated that [facility] sent him to [facility] "to get checked out." Pt mentioned "some drug" he was prescribed whe he went through detox at [facility] recently (a few weeks ago estimation by pt). Pt denied si, HI, NSSH, AVH and paranoia. Pt stated he drinks alcohol and smokes cannabis daily. Pt stated his beggest stressor is that both his 2 children (69 yo daughter and 24 yo son) were shot and killed around Thanksgiving 2023. Pt stated "all I want is justice." Pt stated he is "staying with a friend" currently. Pt stated that his focus is "seeking justice for my children." Pt stated that he is currently unemployed and separated from his wife. Pt stated that he currently has pending DWI charges against him". Per MD, patient is requesting to go to [facility] at discharge. Referral has been sent for review. LCSW will make an attempt to follow up with patient at a later time   Lucius Conn, [place] [facility] Ph: [phone]

## 2022-09-20 NOTE — ED Notes (Signed)
Patient asked this nurse if she was the nurse from the previous day when he came in with the rope in his jeans. Informed the patient that this nurse was not here yesterday. Patient began to state that the arm band was not working. Offered to give patient another arm band to help hold his jeans up. Patient continued to walk away and ignore this nurse.

## 2022-09-20 NOTE — ED Notes (Signed)
Received patient this PM. Patient watching TV with peers. Patient respirations are even and unlabored. Will continue to monitor for safety.

## 2022-09-20 NOTE — ED Provider Notes (Signed)
Behavioral Health Progress Note  Date and Time: 09/20/2022 1:56 PM Name: [name] [name] MRN:  [id number]  Subjective:   The patient is a 52 year old male with a history of alcohol use disorder and CAD status post PCI.  Relatively limited psychiatric history documented in the medical record.  Reportedly he has been unemployed living with a friend recently.  He denies previous suicide attempts or self-harm.  Desires residential rehab at [facility], which he went to several years ago.  Referral has been placed by LCSW.  Patient to receive sober living resources and encouraged to call in case he does not get admission to [facility].  Patient started on Remeron for mood and sleep..  The patient denies auditory/visual hallucinations and first rank symptoms.  The patient reports good mood, appetite, and sleep. They deny suicidal and homicidal thoughts. The patient denies side effects from their medications.  Review of systems as below. The patient denies experiencing any withdrawal symptoms.   Diagnosis:  Final diagnoses:  Polysubstance abuse ([place])    Total Time spent with patient: 20 minutes  Past Psychiatric History: as above Past Medical History:  Past Medical History:  Diagnosis Date   Benign essential HTN    CAD (coronary artery disease)    Unstable angina [facility])     Past Surgical History:  Procedure Laterality Date   CARDIAC CATHETERIZATION  05/15/07   with stenting   cardiac stents     Family History: No family history on file. Family Psychiatric  History: none Social History:  Social History   Substance and Sexual Activity  Alcohol Use Yes     Social History   Substance and Sexual Activity  Drug Use Yes   Types: Marijuana   Comment: daily    Social History   Socioeconomic History   Marital status: Married    Spouse name: Not on file   Number of children: Not on file   Years of education: Not on file   Highest education level: Not on file  Occupational History   Not on  file  Tobacco Use   Smoking status: Every Day    Packs/day: 1.00    Types: Cigarettes   Smokeless tobacco: Not on file  Substance and Sexual Activity   Alcohol use: Yes   Drug use: Yes    Types: Marijuana    Comment: daily   Sexual activity: Not on file  Other Topics Concern   Not on file  Social History Narrative   Not on file   Social Determinants of Health   Financial Resource Strain: Not on file  Food Insecurity: Not on file  Transportation Needs: Not on file  Physical Activity: Not on file  Stress: Not on file  Social Connections: Not on file   SDOH:  SDOH Screenings   Depression (PHQ2-9): Low Risk  (09/19/2022)  Tobacco Use: High Risk (02/23/2021)   Additional Social History:    Pain Medications: see MAR Prescriptions: see MAR Over the Counter: see MAR History of alcohol / drug use?: Yes Longest period of sobriety (when/how long): unknown Negative Consequences of Use: Legal (DWI) Withdrawal Symptoms: None (None reported) Name of Substance 1: alcohol 1 - Age of First Use: teen 1 - Amount (size/oz): varies 1 - Frequency: daily 1 - Duration: ongoing 1 - Last Use / Amount: yesterday 1 - Method of Aquiring: purchase 1- Route of Use: drink/oral Name of Substance 2: cannabis 2 - Age of First Use: 22 2 - Amount (size/oz): varies 2 - Frequency:  daily 2 - Duration: ongoing 2 - Last Use / Amount: yesterday 2 - Method of Aquiring: unknown 2 - Route of Substance Use: smoke                Sleep: Fair  Appetite:  Fair  Current Medications:  Current Facility-Administered Medications  Medication Dose Route Frequency Provider Last Rate Last Admin   acetaminophen (TYLENOL) tablet 650 mg  650 mg Oral Q6H PRN Lauree Chandler, NP       alum & mag hydroxide-simeth (MAALOX/MYLANTA) 200-200-20 MG/5ML suspension 30 mL  30 mL Oral Q4H PRN Lauree Chandler, NP       aspirin tablet 325 mg  325 mg Oral Daily Lauree Chandler, NP   325 mg at 09/20/22 5053    hydrOXYzine (ATARAX) tablet 25 mg  25 mg Oral Q6H PRN Lauree Chandler, NP       loperamide (IMODIUM) capsule 2-4 mg  2-4 mg Oral PRN Lauree Chandler, NP       LORazepam (ATIVAN) tablet 1 mg  1 mg Oral Q6H PRN Lauree Chandler, NP       magnesium hydroxide (MILK OF MAGNESIA) suspension 30 mL  30 mL Oral Daily PRN Lauree Chandler, NP       multivitamin with minerals tablet 1 tablet  1 tablet Oral Daily Lauree Chandler, NP   1 tablet at 09/20/22 0924   ondansetron (ZOFRAN-ODT) disintegrating tablet 4 mg  4 mg Oral Q6H PRN Lauree Chandler, NP       thiamine (VITAMIN B1) injection 100 mg  100 mg Intramuscular Once Lauree Chandler, NP       thiamine (VITAMIN B1) tablet 100 mg  100 mg Oral Daily Lauree Chandler, NP   100 mg at 09/20/22 9767   Current Outpatient Medications  Medication Sig Dispense Refill   aspirin 325 MG tablet Take 325 mg by mouth daily.     tetrahydrozoline 0.05 % ophthalmic solution Place 2 drops into both eyes daily as needed (For dry eyes or allergies).      Labs  Lab Results:  Admission on 09/19/2022  Component Date Value Ref Range Status   SARS Coronavirus 2 by RT PCR 09/19/2022 NEGATIVE  NEGATIVE Final   Comment: (NOTE) SARS-CoV-2 target nucleic acids are NOT DETECTED.  The SARS-CoV-2 RNA is generally detectable in upper respiratory specimens during the acute phase of infection. The lowest concentration of SARS-CoV-2 viral copies this assay can detect is 138 copies/mL. A negative result does not preclude SARS-Cov-2 infection and should not be used as the sole basis for treatment or other patient management decisions. A negative result may occur with  improper specimen collection/handling, submission of specimen other than nasopharyngeal swab, presence of viral mutation(s) within the areas targeted by this assay, and inadequate number of viral copies(<138 copies/mL). A negative result must be combined with clinical observations, patient  history, and epidemiological information. The expected result is Negative.  Fact Sheet for Patients:  BloggerCourse.com  Fact Sheet for Healthcare Providers:  SeriousBroker.it  This test is no                          t yet approved or cleared by the [place] FDA and  has been authorized for detection and/or diagnosis of SARS-CoV-2 by FDA under an Emergency Use Authorization (EUA). This EUA will remain  in effect (meaning this test can be used) for the duration of  the COVID-19 declaration under Section 564(b)(1) of the Act, 21 U.S.C.section 360bbb-3(b)(1), unless the authorization is terminated  or revoked sooner.       Influenza A by PCR 09/19/2022 NEGATIVE  NEGATIVE Final   Influenza B by PCR 09/19/2022 NEGATIVE  NEGATIVE Final   Comment: (NOTE) The Xpert Xpress SARS-CoV-2/FLU/RSV plus assay is intended as an aid in the diagnosis of influenza from Nasopharyngeal swab specimens and should not be used as a sole basis for treatment. Nasal washings and aspirates are unacceptable for Xpert Xpress SARS-CoV-2/FLU/RSV testing.  Fact Sheet for Patients: BloggerCourse.com  Fact Sheet for Healthcare Providers: SeriousBroker.it  This test is not yet approved or cleared by the [place] FDA and has been authorized for detection and/or diagnosis of SARS-CoV-2 by FDA under an Emergency Use Authorization (EUA). This EUA will remain in effect (meaning this test can be used) for the duration of the COVID-19 declaration under Section 564(b)(1) of the Act, 21 U.S.C. section 360bbb-3(b)(1), unless the authorization is terminated or revoked.     Resp Syncytial Virus by PCR 09/19/2022 NEGATIVE  NEGATIVE Final   Comment: (NOTE) Fact Sheet for Patients: BloggerCourse.com  Fact Sheet for Healthcare Providers: SeriousBroker.it  This  test is not yet approved or cleared by the [place] FDA and has been authorized for detection and/or diagnosis of SARS-CoV-2 by FDA under an Emergency Use Authorization (EUA). This EUA will remain in effect (meaning this test can be used) for the duration of the COVID-19 declaration under Section 564(b)(1) of the Act, 21 U.S.C. section 360bbb-3(b)(1), unless the authorization is terminated or revoked.  Performed at [facility], [address], [place], [place] [address]    WBC 09/19/2022 17.8 (H)  4.0 - 10.5 K/uL Final   RBC 09/19/2022 3.98 (L)  4.22 - 5.81 MIL/uL Final   Hemoglobin 09/19/2022 8.9 (L)  13.0 - 17.0 g/dL Final   Comment: Reticulocyte Hemoglobin testing may be clinically indicated, consider ordering this additional test [id number]    HCT 09/19/2022 30.0 (L)  39.0 - 52.0 % Final   MCV 09/19/2022 75.4 (L)  80.0 - 100.0 fL Final   MCH 09/19/2022 22.4 (L)  26.0 - 34.0 pg Final   MCHC 09/19/2022 29.7 (L)  30.0 - 36.0 g/dL Final   RDW 46/96/2952 17.0 (H)  11.5 - 15.5 % Final   Platelets 09/19/2022 350  150 - 400 K/uL Final   nRBC 09/19/2022 0.0  0.0 - 0.2 % Final   Neutrophils Relative % 09/19/2022 75  % Final   Neutro Abs 09/19/2022 13.2 (H)  1.7 - 7.7 K/uL Final   Lymphocytes Relative 09/19/2022 10  % Final   Lymphs Abs 09/19/2022 1.7  0.7 - 4.0 K/uL Final   Monocytes Relative 09/19/2022 15  % Final   Monocytes Absolute 09/19/2022 2.7 (H)  0.1 - 1.0 K/uL Final   Eosinophils Relative 09/19/2022 0  % Final   Eosinophils Absolute 09/19/2022 0.0  0.0 - 0.5 K/uL Final   Basophils Relative 09/19/2022 0  % Final   Basophils Absolute 09/19/2022 0.1  0.0 - 0.1 K/uL Final   Immature Granulocytes 09/19/2022 0  % Final   Abs Immature Granulocytes 09/19/2022 0.07  0.00 - 0.07 K/uL Final   Performed at [facility], [address], [place], [place] [address]   Sodium 09/19/2022 136  135 - 145 mmol/L Final   Potassium 09/19/2022 3.9  3.5 - 5.1 mmol/L Final    Chloride 09/19/2022 97 (L)  98 -  111 mmol/L Final   CO2 09/19/2022 25  22 - 32 mmol/L Final   Glucose, Bld 09/19/2022 94  70 - 99 mg/dL Final   Glucose reference range applies only to samples taken after fasting for at least 8 hours.   BUN 09/19/2022 10  6 - 20 mg/dL Final   Creatinine, Ser 09/19/2022 1.00  0.61 - 1.24 mg/dL Final   Calcium 16/10/960401/18/2024 9.2  8.9 - 10.3 mg/dL Final   Total Protein 54/09/811901/18/2024 7.9  6.5 - 8.1 g/dL Final   Albumin 14/78/295601/18/2024 3.8  3.5 - 5.0 g/dL Final   AST 21/30/865701/18/2024 25  15 - 41 U/L Final   ALT 09/19/2022 21  0 - 44 U/L Final   Alkaline Phosphatase 09/19/2022 48  38 - 126 U/L Final   Total Bilirubin 09/19/2022 0.5  0.3 - 1.2 mg/dL Final   GFR, Estimated 09/19/2022 >60  >60 mL/min Final   Comment: (NOTE) Calculated using the CKD-EPI Creatinine Equation (2021)    Anion gap 09/19/2022 14  5 - 15 Final   Performed at [facility], [address] [place], [place] [address]   Alcohol, Ethyl (B) 09/19/2022 54 (H)  <10 mg/dL Final   Comment: (NOTE) Lowest detectable limit for serum alcohol is 10 mg/dL.  For medical purposes only. Performed at [facility], [address] [place], [place] [address]    Hgb A1c MFr Bld 09/19/2022 5.2  4.8 - 5.6 % Final   Comment: (NOTE) Pre diabetes:          5.7%-6.4%  Diabetes:              >6.4%  Glycemic control for   <7.0% adults with diabetes    Mean Plasma Glucose 09/19/2022 102.54  mg/dL Final   Performed at [facility], [address] [place], [place] [address]   Cholesterol 09/19/2022 123  0 - 200 mg/dL Final   Triglycerides 40/10/272501/18/2024 68  <150 mg/dL Final   HDL 36/64/403401/18/2024 42  >40 mg/dL Final   Total CHOL/HDL Ratio 09/19/2022 2.9  RATIO Final   VLDL 09/19/2022 14  0 - 40 mg/dL Final   LDL Cholesterol 09/19/2022 67  0 - 99 mg/dL Final   Comment:        Total Cholesterol/HDL:CHD Risk Coronary Heart Disease Risk Table                     Men   Women  1/2 Average Risk   3.4   3.3  Average  Risk       5.0   4.4  2 X Average Risk   9.6   7.1  3 X Average Risk  23.4   11.0        Use the calculated Patient Ratio above and the CHD Risk Table to determine the patient's CHD Risk.        ATP III CLASSIFICATION (LDL):  <100     mg/dL   Optimal  [id number]  mg/dL   Near or Above                    Optimal  130-159  mg/dL   Borderline  [phone]  mg/dL   High  >433>190     mg/dL   Very High Performed at [facility], [address] [place], [place] [address]    POC Amphetamine UR 09/19/2022 None Detected  NONE DETECTED (Cut Off Level 1000 ng/mL) Final  POC Secobarbital (BAR) 09/19/2022 None Detected  NONE DETECTED (Cut Off Level 300 ng/mL) Final   POC Buprenorphine (BUP) 09/19/2022 None Detected  NONE DETECTED (Cut Off Level 10 ng/mL) Final   POC Oxazepam (BZO) 09/19/2022 None Detected  NONE DETECTED (Cut Off Level 300 ng/mL) Final   POC Cocaine UR 09/19/2022 None Detected  NONE DETECTED (Cut Off Level 300 ng/mL) Final   POC Methamphetamine UR 09/19/2022 None Detected  NONE DETECTED (Cut Off Level 1000 ng/mL) Final   POC Morphine 09/19/2022 None Detected  NONE DETECTED (Cut Off Level 300 ng/mL) Final   POC Methadone UR 09/19/2022 None Detected  NONE DETECTED (Cut Off Level 300 ng/mL) Final   POC Oxycodone UR 09/19/2022 None Detected  NONE DETECTED (Cut Off Level 100 ng/mL) Final   POC Marijuana UR 09/19/2022 Positive (A)  NONE DETECTED (Cut Off Level 50 ng/mL) Final    Blood Alcohol level:  Lab Results  Component Value Date   ETH 54 (H) 09/19/2022   ETH 213 (H) 07/30/2014    Metabolic Disorder Labs: Lab Results  Component Value Date   HGBA1C 5.2 09/19/2022   MPG 102.54 09/19/2022   No results found for: "PROLACTIN" Lab Results  Component Value Date   CHOL 123 09/19/2022   TRIG 68 09/19/2022   HDL 42 09/19/2022   CHOLHDL 2.9 09/19/2022   VLDL 14 09/19/2022   LDLCALC 67 09/19/2022   LDLCALC 101 (H) 11/11/2007    Therapeutic Lab Levels: No results found for:  "LITHIUM" No results found for: "VALPROATE" No results found for: "CBMZ"  Physical Findings   PHQ2-9    Flowsheet Row ED [facility] 09/19/2022 in [facility] Documentation from 02/25/2019 in [facility] Documentation from 02/11/2019 in [facility] Documentation from 01/13/2019 in [facility] Documentation from 01/06/2019 in [facility]  PHQ-2 Total Score 1 0 0 0 0  PHQ-9 Total Score -- 0 0 0 0      Flowsheet Row ED from 09/19/2022 in [facility] ED from 02/23/2021 in [facility] RISK CATEGORY No Risk Error: Question 6 not populated        Musculoskeletal  Strength & Muscle Tone: within normal limits Gait & Station: normal Patient leans: N/A  Psychiatric Specialty Exam: Physical Exam Constitutional:      Appearance: the patient is not toxic-appearing.  Pulmonary:     Effort: Pulmonary effort is normal.  Neurological:     General: No focal deficit present.     Mental Status: the patient is alert and oriented to person, place, and time.   Review of Systems  Respiratory:  Negative for shortness of breath.   Cardiovascular:  Negative for chest pain.  Gastrointestinal:  Negative for abdominal pain, constipation, diarrhea, nausea and vomiting.  Neurological:  Negative for headaches.      BP (!) 148/88   Pulse 96   Temp 99 F (37.2 C) (Oral)   Resp 18   Ht 5\' 6"  (1.676 m)   Wt 170 lb (77.1 kg)   SpO2 98%   BMI 27.44 kg/m   General Appearance: Fairly Groomed  Eye Contact:  Good  Speech:  Clear and Coherent  Volume:  Normal  Mood:  Euthymic  Affect:  Congruent  Thought Process:  Coherent  Orientation:  Full (Time, Place, and Person)  Thought Content: Logical   Suicidal Thoughts:  No  Homicidal Thoughts:  No  Memory:  Immediate;  Good  Judgement:  fair  Insight:  fair  Psychomotor Activity:  Normal  Concentration:   Concentration: Good  Recall:  Good  Fund of Knowledge: Good  Language: Good  Akathisia:  No  Handed:    AIMS (if indicated): not done  Assets:  Communication Skills Desire for Improvement Leisure Time Physical Health  ADL's:  Intact  Cognition: WNL  Sleep:  Fair   Treatment Plan Summary: Daily contact with patient to assess and evaluate symptoms and progress in treatment and Medication management  Status: Voluntary, no acute safety concerns may be discharged if he wishes to leave AMA  Alcohol use disorder, no present withdrawal, no history of alcohol withdrawal syndrome - CIWA with as needed Ativan - Remeron 7.5 mg nightly for depression and insomnia, likely will increase tomorrow  CAD status post PCI - Reported by the patient - Continue aspirin 325 mg daily  Medical: - Leukocytosis and anemia, both of which appear new.  ED physician left a note stating the patient does not require urgent medical attention.  Plan to recheck labs tomorrow.  Expect significant reduction in leukocytosis and stable hemoglobin.  Corky Sox, MD 09/20/2022 1:56 PM

## 2022-09-21 DIAGNOSIS — F1910 Other psychoactive substance abuse, uncomplicated: Secondary | ICD-10-CM | POA: Diagnosis not present

## 2022-09-21 LAB — CBC WITH DIFFERENTIAL/PLATELET
Abs Immature Granulocytes: 0.05 10*3/uL (ref 0.00–0.07)
Basophils Absolute: 0.1 10*3/uL (ref 0.0–0.1)
Basophils Relative: 1 %
Eosinophils Absolute: 0.4 10*3/uL (ref 0.0–0.5)
Eosinophils Relative: 3 %
HCT: 33.9 % — ABNORMAL LOW (ref 39.0–52.0)
Hemoglobin: 9.4 g/dL — ABNORMAL LOW (ref 13.0–17.0)
Immature Granulocytes: 0 %
Lymphocytes Relative: 27 %
Lymphs Abs: 3.3 10*3/uL (ref 0.7–4.0)
MCH: 21.8 pg — ABNORMAL LOW (ref 26.0–34.0)
MCHC: 27.7 g/dL — ABNORMAL LOW (ref 30.0–36.0)
MCV: 78.5 fL — ABNORMAL LOW (ref 80.0–100.0)
Monocytes Absolute: 1.7 10*3/uL — ABNORMAL HIGH (ref 0.1–1.0)
Monocytes Relative: 14 %
Neutro Abs: 6.5 10*3/uL (ref 1.7–7.7)
Neutrophils Relative %: 55 %
Platelets: 458 10*3/uL — ABNORMAL HIGH (ref 150–400)
RBC: 4.32 MIL/uL (ref 4.22–5.81)
RDW: 17.1 % — ABNORMAL HIGH (ref 11.5–15.5)
WBC: 11.9 10*3/uL — ABNORMAL HIGH (ref 4.0–10.5)
nRBC: 0 % (ref 0.0–0.2)

## 2022-09-21 MED ORDER — TRIPLE ANTIBIOTIC 3.5-400-5000 EX OINT
1.0000 | TOPICAL_OINTMENT | Freq: Two times a day (BID) | CUTANEOUS | Status: DC | PRN
  Administered 2022-09-21 – 2022-09-24 (×8): 1 via TOPICAL
  Filled 2022-09-21 (×8): qty 1

## 2022-09-21 MED ORDER — NICOTINE 21 MG/24HR TD PT24
21.0000 mg | MEDICATED_PATCH | Freq: Every day | TRANSDERMAL | Status: DC
  Administered 2022-09-21 – 2022-09-24 (×4): 21 mg via TRANSDERMAL
  Filled 2022-09-21: qty 1
  Filled 2022-09-21: qty 14
  Filled 2022-09-21 (×3): qty 1

## 2022-09-21 MED ORDER — BENZONATATE 100 MG PO CAPS
200.0000 mg | ORAL_CAPSULE | Freq: Two times a day (BID) | ORAL | Status: DC
  Administered 2022-09-21 – 2022-09-24 (×8): 200 mg via ORAL
  Filled 2022-09-21 (×8): qty 2

## 2022-09-21 MED ORDER — MIRTAZAPINE 15 MG PO TABS
15.0000 mg | ORAL_TABLET | Freq: Every day | ORAL | Status: DC
  Administered 2022-09-21 – 2022-09-24 (×4): 15 mg via ORAL
  Filled 2022-09-21 (×4): qty 1
  Filled 2022-09-21: qty 14

## 2022-09-21 NOTE — ED Notes (Signed)
Patient observed/ assess in dayroom sitting eating a snack, watching TV, and conversing with other patients. Alert and oriented x 3. Patient verbalizes complaint of decreased appetite. Eye Contact is minimal and affect is intense. Patient denies S/I, H/I, and A/V/H. Patient last had a BM today 09/21/22 without issue. Will continue to monitor/Support.

## 2022-09-21 NOTE — ED Notes (Signed)
Blood specimen obtained via R AC x 1 stick. Pt tolerated well. DASH called to collect specimen and to deliver to [facility].

## 2022-09-21 NOTE — ED Notes (Signed)
Patient watched Alcohol Recovery Video, there was a discussion on what they watched, they were able to identify with what they saw, there was more than one video they watched. It was informative and the patients were engaged in the video. There was a group discussion afterward. 

## 2022-09-21 NOTE — ED Provider Notes (Addendum)
Behavioral Health Progress Note  Date and Time: 09/21/2022 11:57 AM Name: [name] [name] MRN:  [id number]  Subjective:    The patient is a 52 year old male with a history of alcohol use disorder and CAD status post PCI. Relatively limited psychiatric history documented in the medical record. Reportedly he has been unemployed living with a friend recently. He denies previous suicide attempts or self-harm.   On assessment today patient reports that he slept fairly well last night. Patient reports that his mood is a "7/10" with 10 being most optimal. Patient endorses that he is a bit annoyed by the diet offered, as he has always tried to eat well due to his CAD hx. Patient reports that this is also why he takes 325mg  ASA at the request of his cardiologist. Patient reports that despite the diet offered he feels that his appetite is stable. Patient reports that he is sleeping "ok" but endorses that he still feels it is a bit difficult to fall asleep.   Patient denies any cravings or urges. Patient reports that he is intends to go to [facility]. Patient denies SI, HI, and AVH on assessment.   Objectively, patient does seem mildly anxious. He constantly asks questions about details of his care, but he asks the same questions multiple times almost as if too anxious to hear what was just said. He also appears mildly irritable, but redirectable and not a behavior concern.   Diagnosis:  Final diagnoses:  Polysubstance abuse (HCC)    Total Time spent with patient: 20 minutes  Past Psychiatric History: Per above Past Medical History:  Past Medical History:  Diagnosis Date   Benign essential HTN    CAD (coronary artery disease)    Unstable angina [facility])     Past Surgical History:  Procedure Laterality Date   CARDIAC CATHETERIZATION  05/15/07   with stenting   cardiac stents     Family History: No family history on file. Family Psychiatric  History: None Social History:  Social History    Substance and Sexual Activity  Alcohol Use Yes     Social History   Substance and Sexual Activity  Drug Use Yes   Types: Marijuana   Comment: daily    Social History   Socioeconomic History   Marital status: Married    Spouse name: Not on file   Number of children: Not on file   Years of education: Not on file   Highest education level: Not on file  Occupational History   Not on file  Tobacco Use   Smoking status: Every Day    Packs/day: 1.00    Types: Cigarettes   Smokeless tobacco: Not on file  Substance and Sexual Activity   Alcohol use: Yes   Drug use: Yes    Types: Marijuana    Comment: daily   Sexual activity: Not on file  Other Topics Concern   Not on file  Social History Narrative   Not on file   Social Determinants of Health   Financial Resource Strain: Not on file  Food Insecurity: Not on file  Transportation Needs: Not on file  Physical Activity: Not on file  Stress: Not on file  Social Connections: Not on file   SDOH:  SDOH Screenings   Depression (PHQ2-9): Low Risk  (09/21/2022)  Tobacco Use: High Risk (02/23/2021)   Additional Social History:    Pain Medications: see MAR Prescriptions: see MAR Over the Counter: see MAR History of alcohol / drug use?: Yes  Longest period of sobriety (when/how long): unknown Negative Consequences of Use: Legal (DWI) Withdrawal Symptoms: None (None reported) Name of Substance 1: alcohol 1 - Age of First Use: teen 1 - Amount (size/oz): varies 1 - Frequency: daily 1 - Duration: ongoing 1 - Last Use / Amount: yesterday 1 - Method of Aquiring: purchase 1- Route of Use: drink/oral Name of Substance 2: cannabis 2 - Age of First Use: 22 2 - Amount (size/oz): varies 2 - Frequency: daily 2 - Duration: ongoing 2 - Last Use / Amount: yesterday 2 - Method of Aquiring: unknown 2 - Route of Substance Use: smoke                Sleep: Fair  Appetite:  Good  Current Medications:  Current  Facility-Administered Medications  Medication Dose Route Frequency Provider Last Rate Last Admin   acetaminophen (TYLENOL) tablet 650 mg  650 mg Oral Q6H PRN Lauree Chandler, NP       alum & mag hydroxide-simeth (MAALOX/MYLANTA) 200-200-20 MG/5ML suspension 30 mL  30 mL Oral Q4H PRN Lauree Chandler, NP       aspirin tablet 325 mg  325 mg Oral Daily Lauree Chandler, NP   325 mg at 09/21/22 0910   hydrOXYzine (ATARAX) tablet 25 mg  25 mg Oral Q6H PRN Lauree Chandler, NP       loperamide (IMODIUM) capsule 2-4 mg  2-4 mg Oral PRN Lauree Chandler, NP       LORazepam (ATIVAN) tablet 1 mg  1 mg Oral Q6H PRN Lauree Chandler, NP       magnesium hydroxide (MILK OF MAGNESIA) suspension 30 mL  30 mL Oral Daily PRN Lauree Chandler, NP       mirtazapine (REMERON) tablet 15 mg  15 mg Oral QHS Eliseo Gum B, MD       multivitamin with minerals tablet 1 tablet  1 tablet Oral Daily Lauree Chandler, NP   1 tablet at 09/21/22 0910   neomycin-bacitracin-polymyxin [phone] OINT 1 Application  1 Application Topical BID PRN Bobbye Morton, MD       nicotine (NICODERM CQ - dosed in mg/24 hours) patch 21 mg  21 mg Transdermal Daily Eliseo Gum B, MD       ondansetron (ZOFRAN-ODT) disintegrating tablet 4 mg  4 mg Oral Q6H PRN Lauree Chandler, NP       thiamine (VITAMIN B1) injection 100 mg  100 mg Intramuscular Once Lauree Chandler, NP       thiamine (VITAMIN B1) tablet 100 mg  100 mg Oral Daily Lauree Chandler, NP   100 mg at 09/21/22 8032   Current Outpatient Medications  Medication Sig Dispense Refill   aspirin 325 MG tablet Take 325 mg by mouth daily.     tetrahydrozoline 0.05 % ophthalmic solution Place 2 drops into both eyes daily as needed (For dry eyes or allergies).      Labs  Lab Results:  Admission on 09/19/2022  Component Date Value Ref Range Status   SARS Coronavirus 2 by RT PCR 09/19/2022 NEGATIVE  NEGATIVE Final   Comment: (NOTE) SARS-CoV-2 target  nucleic acids are NOT DETECTED.  The SARS-CoV-2 RNA is generally detectable in upper respiratory specimens during the acute phase of infection. The lowest concentration of SARS-CoV-2 viral copies this assay can detect is 138 copies/mL. A negative result does not preclude SARS-Cov-2 infection and should not be used as the sole basis for treatment or other  patient management decisions. A negative result may occur with  improper specimen collection/handling, submission of specimen other than nasopharyngeal swab, presence of viral mutation(s) within the areas targeted by this assay, and inadequate number of viral copies(<138 copies/mL). A negative result must be combined with clinical observations, patient history, and epidemiological information. The expected result is Negative.  Fact Sheet for Patients:  EntrepreneurPulse.com.au  Fact Sheet for Healthcare Providers:  IncredibleEmployment.be  This test is no                          t yet approved or cleared by the [place] FDA and  has been authorized for detection and/or diagnosis of SARS-CoV-2 by FDA under an Emergency Use Authorization (EUA). This EUA will remain  in effect (meaning this test can be used) for the duration of the COVID-19 declaration under Section 564(b)(1) of the Act, 21 U.S.C.section 360bbb-3(b)(1), unless the authorization is terminated  or revoked sooner.       Influenza A by PCR 09/19/2022 NEGATIVE  NEGATIVE Final   Influenza B by PCR 09/19/2022 NEGATIVE  NEGATIVE Final   Comment: (NOTE) The Xpert Xpress SARS-CoV-2/FLU/RSV plus assay is intended as an aid in the diagnosis of influenza from Nasopharyngeal swab specimens and should not be used as a sole basis for treatment. Nasal washings and aspirates are unacceptable for Xpert Xpress SARS-CoV-2/FLU/RSV testing.  Fact Sheet for Patients: EntrepreneurPulse.com.au  Fact Sheet for Healthcare  Providers: IncredibleEmployment.be  This test is not yet approved or cleared by the [place] FDA and has been authorized for detection and/or diagnosis of SARS-CoV-2 by FDA under an Emergency Use Authorization (EUA). This EUA will remain in effect (meaning this test can be used) for the duration of the COVID-19 declaration under Section 564(b)(1) of the Act, 21 U.S.C. section 360bbb-3(b)(1), unless the authorization is terminated or revoked.     Resp Syncytial Virus by PCR 09/19/2022 NEGATIVE  NEGATIVE Final   Comment: (NOTE) Fact Sheet for Patients: EntrepreneurPulse.com.au  Fact Sheet for Healthcare Providers: IncredibleEmployment.be  This test is not yet approved or cleared by the [place] FDA and has been authorized for detection and/or diagnosis of SARS-CoV-2 by FDA under an Emergency Use Authorization (EUA). This EUA will remain in effect (meaning this test can be used) for the duration of the COVID-19 declaration under Section 564(b)(1) of the Act, 21 U.S.C. section 360bbb-3(b)(1), unless the authorization is terminated or revoked.  Performed at [facility], [place] [address], [place], [place] [address]    WBC 09/19/2022 17.8 (H)  4.0 - 10.5 K/uL Final   RBC 09/19/2022 3.98 (L)  4.22 - 5.81 MIL/uL Final   Hemoglobin 09/19/2022 8.9 (L)  13.0 - 17.0 g/dL Final   Comment: Reticulocyte Hemoglobin testing may be clinically indicated, consider ordering this additional test [id number]    HCT 09/19/2022 30.0 (L)  39.0 - 52.0 % Final   MCV 09/19/2022 75.4 (L)  80.0 - 100.0 fL Final   MCH 09/19/2022 22.4 (L)  26.0 - 34.0 pg Final   MCHC 09/19/2022 29.7 (L)  30.0 - 36.0 g/dL Final   RDW 09/19/2022 17.0 (H)  11.5 - 15.5 % Final   Platelets 09/19/2022 350  150 - 400 K/uL Final   nRBC 09/19/2022 0.0  0.0 - 0.2 % Final   Neutrophils Relative % 09/19/2022 75  % Final   Neutro Abs 09/19/2022 13.2 (H)  1.7 - 7.7 K/uL  Final   Lymphocytes Relative 09/19/2022  10  % Final   Lymphs Abs 09/19/2022 1.7  0.7 - 4.0 K/uL Final   Monocytes Relative 09/19/2022 15  % Final   Monocytes Absolute 09/19/2022 2.7 (H)  0.1 - 1.0 K/uL Final   Eosinophils Relative 09/19/2022 0  % Final   Eosinophils Absolute 09/19/2022 0.0  0.0 - 0.5 K/uL Final   Basophils Relative 09/19/2022 0  % Final   Basophils Absolute 09/19/2022 0.1  0.0 - 0.1 K/uL Final   Immature Granulocytes 09/19/2022 0  % Final   Abs Immature Granulocytes 09/19/2022 0.07  0.00 - 0.07 K/uL Final   Performed at [facility], [address] [place], [place] [address]   Sodium 09/19/2022 136  135 - 145 mmol/L Final   Potassium 09/19/2022 3.9  3.5 - 5.1 mmol/L Final   Chloride 09/19/2022 97 (L)  98 - 111 mmol/L Final   CO2 09/19/2022 25  22 - 32 mmol/L Final   Glucose, Bld 09/19/2022 94  70 - 99 mg/dL Final   Glucose reference range applies only to samples taken after fasting for at least 8 hours.   BUN 09/19/2022 10  6 - 20 mg/dL Final   Creatinine, Ser 09/19/2022 1.00  0.61 - 1.24 mg/dL Final   Calcium 19/14/7829 9.2  8.9 - 10.3 mg/dL Final   Total Protein 56/21/3086 7.9  6.5 - 8.1 g/dL Final   Albumin 57/84/6962 3.8  3.5 - 5.0 g/dL Final   AST 95/28/4132 25  15 - 41 U/L Final   ALT 09/19/2022 21  0 - 44 U/L Final   Alkaline Phosphatase 09/19/2022 48  38 - 126 U/L Final   Total Bilirubin 09/19/2022 0.5  0.3 - 1.2 mg/dL Final   GFR, Estimated 09/19/2022 >60  >60 mL/min Final   Comment: (NOTE) Calculated using the CKD-EPI Creatinine Equation (2021)    Anion gap 09/19/2022 14  5 - 15 Final   Performed at [facility], [address], [place], [place] [address]   Alcohol, Ethyl (B) 09/19/2022 54 (H)  <10 mg/dL Final   Comment: (NOTE) Lowest detectable limit for serum alcohol is 10 mg/dL.  For medical purposes only. Performed at [facility], [address] [place], [place] [address]    Hgb A1c MFr Bld 09/19/2022 5.2  4.8 - 5.6 % Final    Comment: (NOTE) Pre diabetes:          5.7%-6.4%  Diabetes:              >6.4%  Glycemic control for   <7.0% adults with diabetes    Mean Plasma Glucose 09/19/2022 102.54  mg/dL Final   Performed at [facility], [address] [place], [place] [address]   Cholesterol 09/19/2022 123  0 - 200 mg/dL Final   Triglycerides 34/74/2595 68  <150 mg/dL Final   HDL 63/87/5643 42  >40 mg/dL Final   Total CHOL/HDL Ratio 09/19/2022 2.9  RATIO Final   VLDL 09/19/2022 14  0 - 40 mg/dL Final   LDL Cholesterol 09/19/2022 67  0 - 99 mg/dL Final   Comment:        Total Cholesterol/HDL:CHD Risk Coronary Heart Disease Risk Table                     Men   Women  1/2 Average Risk   3.4   3.3  Average Risk       5.0   4.4  2 X Average  Risk   9.6   7.1  3 X Average Risk  23.4   11.0        Use the calculated Patient Ratio above and the CHD Risk Table to determine the patient's CHD Risk.        ATP III CLASSIFICATION (LDL):  <100     mg/dL   Optimal  100-129  mg/dL   Near or Above                    Optimal  130-159  mg/dL   Borderline  160-189  mg/dL   High  >190     mg/dL   Very High Performed at [place] [address] [place], [place] [address]    POC Amphetamine UR 09/19/2022 None Detected  NONE DETECTED (Cut Off Level 1000 ng/mL) Final   POC Secobarbital (BAR) 09/19/2022 None Detected  NONE DETECTED (Cut Off Level 300 ng/mL) Final   POC Buprenorphine (BUP) 09/19/2022 None Detected  NONE DETECTED (Cut Off Level 10 ng/mL) Final   POC Oxazepam (BZO) 09/19/2022 None Detected  NONE DETECTED (Cut Off Level 300 ng/mL) Final   POC Cocaine UR 09/19/2022 None Detected  NONE DETECTED (Cut Off Level 300 ng/mL) Final   POC Methamphetamine UR 09/19/2022 None Detected  NONE DETECTED (Cut Off Level 1000 ng/mL) Final   POC Morphine 09/19/2022 None Detected  NONE DETECTED (Cut Off Level 300 ng/mL) Final   POC Methadone UR 09/19/2022 None Detected  NONE DETECTED (Cut Off Level 300 ng/mL)  Final   POC Oxycodone UR 09/19/2022 None Detected  NONE DETECTED (Cut Off Level 100 ng/mL) Final   POC Marijuana UR 09/19/2022 Positive (A)  NONE DETECTED (Cut Off Level 50 ng/mL) Final    Blood Alcohol level:  Lab Results  Component Value Date   ETH 54 (H) 09/19/2022   ETH 213 (H) 12/87/8676    Metabolic Disorder Labs: Lab Results  Component Value Date   HGBA1C 5.2 09/19/2022   MPG 102.54 09/19/2022   No results found for: "PROLACTIN" Lab Results  Component Value Date   CHOL 123 09/19/2022   TRIG 68 09/19/2022   HDL 42 09/19/2022   CHOLHDL 2.9 09/19/2022   VLDL 14 09/19/2022   LDLCALC 67 09/19/2022   LDLCALC 101 (H) 11/11/2007    Therapeutic Lab Levels: No results found for: "LITHIUM" No results found for: "VALPROATE" No results found for: "CBMZ"  Physical Findings   PHQ2-9    [place] ED from 09/19/2022 in [facility] Documentation from 02/25/2019 in [place] Documentation from 02/11/2019 in [place] Documentation from 01/13/2019 in [place] Documentation from 01/06/2019 in [place]  PHQ-2 Total Score 0 0 0 0 0  PHQ-9 Total Score -- 0 0 0 0      [place] ED from 09/19/2022 in [facility] ED from 02/23/2021 in [facility] No Risk Error: Question 6 not populated        Musculoskeletal  Strength & Muscle Tone: within normal limits Gait & Station: normal Patient leans: N/A  Psychiatric Specialty Exam  Presentation  General Appearance:  Appropriate for Environment; Casual  Eye Contact: Good  Speech: Clear and Coherent  Speech Volume: Normal  Handedness: Right   Mood and Affect  Mood: Euthymic  Affect: Appropriate   Thought Process  Thought Processes: Coherent  Descriptions of Associations:Intact  Orientation:Full (Time, Place  and Person)  Thought  Content:Logical  Diagnosis of Schizophrenia or Schizoaffective disorder in past: No    Hallucinations:Hallucinations: None  Ideas of Reference:None  Suicidal Thoughts:Suicidal Thoughts: No  Homicidal Thoughts:Homicidal Thoughts: No   Sensorium  Memory: Immediate Good  Judgment: Fair  Insight: Fair   Executive Functions  Concentration: Good  Attention Span: Good  Recall: Good  Fund of Knowledge: Good  Language: Good   Psychomotor Activity  Psychomotor Activity: Psychomotor Activity: Normal   Assets  Assets: Communication Skills; Desire for Improvement; Resilience   Sleep  Sleep: Sleep: Fair   No data recorded  Physical Exam  Physical Exam HENT:     Head: Normocephalic and atraumatic.  Pulmonary:     Effort: Pulmonary effort is normal.  Neurological:     Mental Status: He is alert and oriented to person, place, and time.    Review of Systems  Psychiatric/Behavioral:  Negative for depression, hallucinations and suicidal ideas. The patient does not have insomnia.    Blood pressure 124/67, pulse 69, temperature 99.5 F (37.5 C), temperature source Oral, resp. rate 16, height 5\' 6"  (1.676 m), weight 170 lb (77.1 kg), SpO2 100 %. Body mass index is 27.44 kg/m.  Treatment Plan Summary: Daily contact with patient to assess and evaluate symptoms and progress in treatment and Medication management  Based on assessment patient overall appears to be improving. He does appear to have some objective irritability and anxiety and may benefit from increase in Remeron. This anxiety may also explain why patient feels he struggles to fall asleep.   Etoh use disorder, severe - D'c CIWA,  > 3 days with score of 0  Substance Induced Mood Disorder - Increase Remeron to 15mg  QHS  CAD status post PCI - Reported by the patient - Continue aspirin 325 mg daily  Leukocytosis  Anemia ED physician left a note stating the patient does not require urgent  medical attention. (1/20) - Repeat Labs  Cough w/ clear sputum - Tessalon 200mg  BID  PGY-3 Bobbye MortonJai B Rahshawn Remo, MD 09/21/2022 11:57 AM

## 2022-09-21 NOTE — ED Notes (Signed)
Patient accepted scheduled medication. Breakfast provided. PHQ-9 filled out and placed in wire basket in chart for provider to review. Safety maintained and will continue to monitor.

## 2022-09-22 DIAGNOSIS — F1910 Other psychoactive substance abuse, uncomplicated: Secondary | ICD-10-CM | POA: Diagnosis not present

## 2022-09-22 MED ORDER — TRAZODONE HCL 50 MG PO TABS
50.0000 mg | ORAL_TABLET | Freq: Every day | ORAL | Status: DC
  Administered 2022-09-22 – 2022-09-24 (×3): 50 mg via ORAL
  Filled 2022-09-22: qty 1
  Filled 2022-09-22: qty 14
  Filled 2022-09-22 (×2): qty 1

## 2022-09-22 NOTE — ED Provider Notes (Addendum)
Behavioral Health Progress Note  Date and Time: 09/22/2022 2:12 PM Name: [name] [name] MRN:  [id number]  Subjective:  The patient is a 52 year old male with a history of alcohol use disorder and CAD status post PCI. Relatively limited psychiatric history documented in the medical record. Reportedly he has been unemployed living with a friend recently. He denies previous suicide attempts or self-harm.    On assessment today patient reports that he continues to struggle with sleep and anxiety. Patient endorses that during the day he stays up ruminating on certain things said to him or that have occurred in the past. Patient reports that at night he struggles with both falling and staying asleep.   Patient revealed to this provider that he unfortunately suffered a significant tragedy in 07/2022, but he does not want this documented in his chart nor does he want to talk about this with providers at the moment. Patient endorsed that he was proud of himself, for bringing it up to this provider, but does not feel ready to talk with anyone else, and partially only told this provider, because he knows this provider is leaving the service.   Patient endorsed that he has noticed he is a bit anxious, but feels that his mood is overall "alright" and his appetite is good. Patient endorses that his meals have been better today.Patient denies SI, HI, and AVH and endorses that he is ready to go to [facility] and participate in their treatment. Patient denies any cravings or urges for Etoh.   Diagnosis:  Final diagnoses:  Polysubstance abuse ([place])    Total Time spent with patient: 45 minutes  Past Psychiatric History: Per above  Past Medical History:  Past Medical History:  Diagnosis Date   Benign essential HTN    CAD (coronary artery disease)    Unstable angina [facility])     Past Surgical History:  Procedure Laterality Date   CARDIAC CATHETERIZATION  05/15/07   with stenting   cardiac stents     Family  History: No family history on file. Family Psychiatric  History: None Social History:  Social History   Substance and Sexual Activity  Alcohol Use Yes     Social History   Substance and Sexual Activity  Drug Use Yes   Types: Marijuana   Comment: daily    Social History   Socioeconomic History   Marital status: Married    Spouse name: Not on file   Number of children: Not on file   Years of education: Not on file   Highest education level: Not on file  Occupational History   Not on file  Tobacco Use   Smoking status: Every Day    Packs/day: 1.00    Types: Cigarettes   Smokeless tobacco: Not on file  Substance and Sexual Activity   Alcohol use: Yes   Drug use: Yes    Types: Marijuana    Comment: daily   Sexual activity: Not on file  Other Topics Concern   Not on file  Social History Narrative   Not on file   Social Determinants of Health   Financial Resource Strain: Not on file  Food Insecurity: Not on file  Transportation Needs: Not on file  Physical Activity: Not on file  Stress: Not on file  Social Connections: Not on file   SDOH:  SDOH Screenings   Depression (PHQ2-9): Low Risk  (09/22/2022)  Tobacco Use: High Risk (02/23/2021)   Additional Social History:    Pain Medications: see  MAR Prescriptions: see MAR Over the Counter: see MAR History of alcohol / drug use?: Yes Longest period of sobriety (when/how long): unknown Negative Consequences of Use: Legal (DWI) Withdrawal Symptoms: None (None reported) Name of Substance 1: alcohol 1 - Age of First Use: teen 1 - Amount (size/oz): varies 1 - Frequency: daily 1 - Duration: ongoing 1 - Last Use / Amount: yesterday 1 - Method of Aquiring: purchase 1- Route of Use: drink/oral Name of Substance 2: cannabis 2 - Age of First Use: 22 2 - Amount (size/oz): varies 2 - Frequency: daily 2 - Duration: ongoing 2 - Last Use / Amount: yesterday 2 - Method of Aquiring: unknown 2 - Route of Substance Use:  smoke                Sleep: Poor  Appetite:  Good  Current Medications:  Current Facility-Administered Medications  Medication Dose Route Frequency Provider Last Rate Last Admin   acetaminophen (TYLENOL) tablet 650 mg  650 mg Oral Q6H PRN Tharon Aquas, NP       alum & mag hydroxide-simeth (MAALOX/MYLANTA) 200-200-20 MG/5ML suspension 30 mL  30 mL Oral Q4H PRN Tharon Aquas, NP       aspirin tablet 325 mg  325 mg Oral Daily Tharon Aquas, NP   325 mg at 09/22/22 [id number]   benzonatate (TESSALON) capsule 200 mg  200 mg Oral BID Damita Dunnings B, MD   200 mg at 09/22/22 [id number]   magnesium hydroxide (MILK OF MAGNESIA) suspension 30 mL  30 mL Oral Daily PRN Tharon Aquas, NP       mirtazapine (REMERON) tablet 15 mg  15 mg Oral QHS Damita Dunnings B, MD   15 mg at 09/21/22 2106   multivitamin with minerals tablet 1 tablet  1 tablet Oral Daily Tharon Aquas, NP   1 tablet at 09/22/22 [id number]   neomycin-bacitracin-polymyxin [id number] OINT 1 Application  1 Application Topical BID PRN Damita Dunnings B, MD   1 Application at [id number]   nicotine (NICODERM CQ - dosed in mg/24 hours) patch 21 mg  21 mg Transdermal Daily Damita Dunnings B, MD   21 mg at 09/22/22 0919   thiamine (VITAMIN B1) injection 100 mg  100 mg Intramuscular Once Tharon Aquas, NP       thiamine (VITAMIN B1) tablet 100 mg  100 mg Oral Daily Tharon Aquas, NP   100 mg at 09/22/22 [id number]   traZODone (DESYREL) tablet 50 mg  50 mg Oral QHS Freida Busman, MD       Current Outpatient Medications  Medication Sig Dispense Refill   aspirin 325 MG tablet Take 325 mg by mouth daily.     tetrahydrozoline 0.05 % ophthalmic solution Place 2 drops into both eyes daily as needed (For dry eyes or allergies).      Labs  Lab Results:  Admission on 09/19/2022  Component Date Value Ref Range Status   SARS Coronavirus 2 by RT PCR 09/19/2022 NEGATIVE  NEGATIVE Final   Comment: (NOTE) SARS-CoV-2 target  nucleic acids are NOT DETECTED.  The SARS-CoV-2 RNA is generally detectable in upper respiratory specimens during the acute phase of infection. The lowest concentration of SARS-CoV-2 viral copies this assay can detect is 138 copies/mL. A negative result does not preclude SARS-Cov-2 infection and should not be used as the sole basis for treatment or other patient management decisions. A negative result may occur with  improper specimen collection/handling, submission of specimen  other than nasopharyngeal swab, presence of viral mutation(s) within the areas targeted by this assay, and inadequate number of viral copies(<138 copies/mL). A negative result must be combined with clinical observations, patient history, and epidemiological information. The expected result is Negative.  Fact Sheet for Patients:  BloggerCourse.com  Fact Sheet for Healthcare Providers:  SeriousBroker.it  This test is no                          t yet approved or cleared by the [place] FDA and  has been authorized for detection and/or diagnosis of SARS-CoV-2 by FDA under an Emergency Use Authorization (EUA). This EUA will remain  in effect (meaning this test can be used) for the duration of the COVID-19 declaration under Section 564(b)(1) of the Act, 21 U.S.C.section 360bbb-3(b)(1), unless the authorization is terminated  or revoked sooner.       Influenza A by PCR 09/19/2022 NEGATIVE  NEGATIVE Final   Influenza B by PCR 09/19/2022 NEGATIVE  NEGATIVE Final   Comment: (NOTE) The Xpert Xpress SARS-CoV-2/FLU/RSV plus assay is intended as an aid in the diagnosis of influenza from Nasopharyngeal swab specimens and should not be used as a sole basis for treatment. Nasal washings and aspirates are unacceptable for Xpert Xpress SARS-CoV-2/FLU/RSV testing.  Fact Sheet for Patients: BloggerCourse.com  Fact Sheet for Healthcare  Providers: SeriousBroker.it  This test is not yet approved or cleared by the [place] FDA and has been authorized for detection and/or diagnosis of SARS-CoV-2 by FDA under an Emergency Use Authorization (EUA). This EUA will remain in effect (meaning this test can be used) for the duration of the COVID-19 declaration under Section 564(b)(1) of the Act, 21 U.S.C. section 360bbb-3(b)(1), unless the authorization is terminated or revoked.     Resp Syncytial Virus by PCR 09/19/2022 NEGATIVE  NEGATIVE Final   Comment: (NOTE) Fact Sheet for Patients: BloggerCourse.com  Fact Sheet for Healthcare Providers: SeriousBroker.it  This test is not yet approved or cleared by the [place] FDA and has been authorized for detection and/or diagnosis of SARS-CoV-2 by FDA under an Emergency Use Authorization (EUA). This EUA will remain in effect (meaning this test can be used) for the duration of the COVID-19 declaration under Section 564(b)(1) of the Act, 21 U.S.C. section 360bbb-3(b)(1), unless the authorization is terminated or revoked.  Performed at [facility], [address] [place], [place] [address]    WBC 09/19/2022 17.8 (H)  4.0 - 10.5 K/uL Final   RBC 09/19/2022 3.98 (L)  4.22 - 5.81 MIL/uL Final   Hemoglobin 09/19/2022 8.9 (L)  13.0 - 17.0 g/dL Final   Comment: Reticulocyte Hemoglobin testing may be clinically indicated, consider ordering this additional test [id number]    HCT 09/19/2022 30.0 (L)  39.0 - 52.0 % Final   MCV 09/19/2022 75.4 (L)  80.0 - 100.0 fL Final   MCH 09/19/2022 22.4 (L)  26.0 - 34.0 pg Final   MCHC 09/19/2022 29.7 (L)  30.0 - 36.0 g/dL Final   RDW 28/78/6767 17.0 (H)  11.5 - 15.5 % Final   Platelets 09/19/2022 350  150 - 400 K/uL Final   nRBC 09/19/2022 0.0  0.0 - 0.2 % Final   Neutrophils Relative % 09/19/2022 75  % Final   Neutro Abs 09/19/2022 13.2 (H)  1.7 - 7.7 K/uL  Final   Lymphocytes Relative 09/19/2022 10  % Final   Lymphs Abs 09/19/2022 1.7  0.7 - 4.0 K/uL Final  Monocytes Relative 09/19/2022 15  % Final   Monocytes Absolute 09/19/2022 2.7 (H)  0.1 - 1.0 K/uL Final   Eosinophils Relative 09/19/2022 0  % Final   Eosinophils Absolute 09/19/2022 0.0  0.0 - 0.5 K/uL Final   Basophils Relative 09/19/2022 0  % Final   Basophils Absolute 09/19/2022 0.1  0.0 - 0.1 K/uL Final   Immature Granulocytes 09/19/2022 0  % Final   Abs Immature Granulocytes 09/19/2022 0.07  0.00 - 0.07 K/uL Final   Performed at [facility], [place] [address], [place], [place] [address]   Sodium 09/19/2022 136  135 - 145 mmol/L Final   Potassium 09/19/2022 3.9  3.5 - 5.1 mmol/L Final   Chloride 09/19/2022 97 (L)  98 - 111 mmol/L Final   CO2 09/19/2022 25  22 - 32 mmol/L Final   Glucose, Bld 09/19/2022 94  70 - 99 mg/dL Final   Glucose reference range applies only to samples taken after fasting for at least 8 hours.   BUN 09/19/2022 10  6 - 20 mg/dL Final   Creatinine, Ser 09/19/2022 1.00  0.61 - 1.24 mg/dL Final   Calcium 09/19/2022 9.2  8.9 - 10.3 mg/dL Final   Total Protein 09/19/2022 7.9  6.5 - 8.1 g/dL Final   Albumin 09/19/2022 3.8  3.5 - 5.0 g/dL Final   AST 09/19/2022 25  15 - 41 U/L Final   ALT 09/19/2022 21  0 - 44 U/L Final   Alkaline Phosphatase 09/19/2022 48  38 - 126 U/L Final   Total Bilirubin 09/19/2022 0.5  0.3 - 1.2 mg/dL Final   GFR, Estimated 09/19/2022 >60  >60 mL/min Final   Comment: (NOTE) Calculated using the CKD-EPI Creatinine Equation (2021)    Anion gap 09/19/2022 14  5 - 15 Final   Performed at [facility] [address] [place], [place] [address]   Alcohol, Ethyl (B) 09/19/2022 54 (H)  <10 mg/dL Final   Comment: (NOTE) Lowest detectable limit for serum alcohol is 10 mg/dL.  For medical purposes only. Performed at [facility], [place] [address], [place], [place] [address]    Hgb A1c MFr Bld 09/19/2022 5.2  4.8 - 5.6 % Final    Comment: (NOTE) Pre diabetes:          5.7%-6.4%  Diabetes:              >6.4%  Glycemic control for   <7.0% adults with diabetes    Mean Plasma Glucose 09/19/2022 102.54  mg/dL Final   Performed at [facility], [place] [address], [place],  [address]   Cholesterol 09/19/2022 123  0 - 200 mg/dL Final   Triglycerides 09/19/2022 68  <150 mg/dL Final   HDL 09/19/2022 42  >40 mg/dL Final   Total CHOL/HDL Ratio 09/19/2022 2.9  RATIO Final   VLDL 09/19/2022 14  0 - 40 mg/dL Final   LDL Cholesterol 09/19/2022 67  0 - 99 mg/dL Final   Comment:        Total Cholesterol/HDL:CHD Risk Coronary Heart Disease Risk Table                     Men   Women  1/2 Average Risk   3.4   3.3  Average Risk       5.0   4.4  2 X Average Risk   9.6   7.1  3 X Average Risk  23.4   11.0  Use the calculated Patient Ratio above and the CHD Risk Table to determine the patient's CHD Risk.        ATP III CLASSIFICATION (LDL):  <100     mg/dL   Optimal  100-129  mg/dL   Near or Above                    Optimal  130-159  mg/dL   Borderline  160-189  mg/dL   High  >190     mg/dL   Very High Performed at [facility] [address] [place], [place] [address]    POC Amphetamine UR 09/19/2022 None Detected  NONE DETECTED (Cut Off Level 1000 ng/mL) Final   POC Secobarbital (BAR) 09/19/2022 None Detected  NONE DETECTED (Cut Off Level 300 ng/mL) Final   POC Buprenorphine (BUP) 09/19/2022 None Detected  NONE DETECTED (Cut Off Level 10 ng/mL) Final   POC Oxazepam (BZO) 09/19/2022 None Detected  NONE DETECTED (Cut Off Level 300 ng/mL) Final   POC Cocaine UR 09/19/2022 None Detected  NONE DETECTED (Cut Off Level 300 ng/mL) Final   POC Methamphetamine UR 09/19/2022 None Detected  NONE DETECTED (Cut Off Level 1000 ng/mL) Final   POC Morphine 09/19/2022 None Detected  NONE DETECTED (Cut Off Level 300 ng/mL) Final   POC Methadone UR 09/19/2022 None Detected  NONE DETECTED (Cut Off Level 300 ng/mL)  Final   POC Oxycodone UR 09/19/2022 None Detected  NONE DETECTED (Cut Off Level 100 ng/mL) Final   POC Marijuana UR 09/19/2022 Positive (A)  NONE DETECTED (Cut Off Level 50 ng/mL) Final   WBC 09/21/2022 11.9 (H)  4.0 - 10.5 K/uL Final   RBC 09/21/2022 4.32  4.22 - 5.81 MIL/uL Final   Hemoglobin 09/21/2022 9.4 (L)  13.0 - 17.0 g/dL Final   HCT 09/21/2022 33.9 (L)  39.0 - 52.0 % Final   MCV 09/21/2022 78.5 (L)  80.0 - 100.0 fL Final   MCH 09/21/2022 21.8 (L)  26.0 - 34.0 pg Final   MCHC 09/21/2022 27.7 (L)  30.0 - 36.0 g/dL Final   RDW 09/21/2022 17.1 (H)  11.5 - 15.5 % Final   Platelets 09/21/2022 458 (H)  150 - 400 K/uL Final   nRBC 09/21/2022 0.0  0.0 - 0.2 % Final   Neutrophils Relative % 09/21/2022 55  % Final   Neutro Abs 09/21/2022 6.5  1.7 - 7.7 K/uL Final   Lymphocytes Relative 09/21/2022 27  % Final   Lymphs Abs 09/21/2022 3.3  0.7 - 4.0 K/uL Final   Monocytes Relative 09/21/2022 14  % Final   Monocytes Absolute 09/21/2022 1.7 (H)  0.1 - 1.0 K/uL Final   Eosinophils Relative 09/21/2022 3  % Final   Eosinophils Absolute 09/21/2022 0.4  0.0 - 0.5 K/uL Final   Basophils Relative 09/21/2022 1  % Final   Basophils Absolute 09/21/2022 0.1  0.0 - 0.1 K/uL Final   Immature Granulocytes 09/21/2022 0  % Final   Abs Immature Granulocytes 09/21/2022 0.05  0.00 - 0.07 K/uL Final   Performed at [facility], [place] [address] [place], [place] [address]    Blood Alcohol level:  Lab Results  Component Value Date   ETH 54 (H) 09/19/2022   ETH 213 (H) [id number]    Metabolic Disorder Labs: Lab Results  Component Value Date   HGBA1C 5.2 09/19/2022   MPG 102.54 09/19/2022   No results found for: "PROLACTIN" Lab Results  Component Value Date  CHOL 123 09/19/2022   TRIG 68 09/19/2022   HDL 42 09/19/2022   CHOLHDL 2.9 09/19/2022   VLDL 14 09/19/2022   LDLCALC 67 09/19/2022   LDLCALC 101 (H) 11/11/2007    Therapeutic Lab Levels: No results found for: "LITHIUM" No results  found for: "VALPROATE" No results found for: "CBMZ"  Physical Findings   PHQ2-9    [place] ED from 09/19/2022 in [facility] Documentation from 02/25/2019 in [place] Documentation from 02/11/2019 in [place] Documentation from 01/13/2019 in [place] Documentation from 01/06/2019 in [place]  PHQ-2 Total Score 0 0 0 0 0  PHQ-9 Total Score -- 0 0 0 0      [place] ED from 09/19/2022 in [facility] ED from 02/23/2021 in [facility] No Risk Error: Question 6 not populated        Musculoskeletal  Strength & Muscle Tone: within normal limits Gait & Station: normal Patient leans: N/A  Psychiatric Specialty Exam  Presentation  General Appearance:  Appropriate for Environment; Casual  Eye Contact: Good  Speech: Clear and Coherent  Speech Volume: Normal  Handedness: Right   Mood and Affect  Mood: Anxious  Affect: Appropriate   Thought Process  Thought Processes: Coherent  Descriptions of Associations:Intact  Orientation:Full (Time, Place and Person)  Thought Content:Logical  Diagnosis of Schizophrenia or Schizoaffective disorder in past: No    Hallucinations:Hallucinations: None  Ideas of Reference:None  Suicidal Thoughts:Suicidal Thoughts: No  Homicidal Thoughts:Homicidal Thoughts: No   Sensorium  Memory: Immediate Good; Recent Good; Remote Good  Judgment: Fair  Insight: Lacking   Executive Functions  Concentration: Good  Attention Span: Good  Recall: Good  Fund of Knowledge: Poor  Language: Good   Psychomotor Activity  Psychomotor Activity: Psychomotor Activity: Normal   Assets  Assets: Resilience; Desire for Improvement; Housing; Social Support   Sleep  Sleep: Sleep: Poor   No data recorded  Physical Exam  Physical Exam HENT:      Head: Normocephalic and atraumatic.  Pulmonary:     Effort: Pulmonary effort is normal.  Neurological:     Mental Status: He is alert and oriented to person, place, and time.    Review of Systems  Psychiatric/Behavioral:  Negative for hallucinations and suicidal ideas. The patient is nervous/anxious.    Blood pressure 124/79, pulse 79, temperature 98.3 F (36.8 C), temperature source Oral, resp. rate 18, height 5\' 6"  (1.676 m), weight 170 lb (77.1 kg), SpO2 100 %. Body mass index is 27.44 kg/m.  Treatment Plan Summary: Daily contact with patient to assess and evaluate symptoms and progress in treatment and Medication management  Patient has very limited insight and despite psychoeducation today about coping skills, anxiety, and grief patient still struggled to understand the importance of processing his emotions in a positive way. Patient was engaged in conversation, but struggled with concepts and accepting the idea of mental health and asking for help beyond substance use. Patient was very concerned with being "crazy" which is was affirmed that he is not "crazy" but his behaviors since his trauma have made family concerned leading to his mental health interactions the last month. Patient has also been noted to be very anxious in general about things said to him, and has been witnessed ruminating in his room, before asking to speak to someone to reiterate the things he has been thinking about.   Etoh use  disorder, severe - Remains interested in [facility]   Substance Induced Mood Disorder Grief - Continue Remeron to 15mg  QHS - Start Trazodone 50mg  QHS - Recommend do not approach patient about this trauma, until he is willing to talk about it himself   CAD status post PCI - Reported by the patient - Continue aspirin 325 mg daily   Leukocytosis  Anemia ED physician left a note stating the patient does not require urgent medical attention. (1/20) - Repeat Labs improved, 1/21   Cough  w/ clear sputum - Tessalon 200mg  BID  PGY-3 Freida Busman, MD 09/22/2022 2:12 PM

## 2022-09-22 NOTE — ED Notes (Signed)
Pt attended group 

## 2022-09-22 NOTE — ED Notes (Signed)
Patient observed/assessed in room in bed appearing in no immediate distress resting peacefully. Q15 minute checks continued by MHT and nursing staff. Will continue to monitor and support. 

## 2022-09-22 NOTE — ED Notes (Signed)
Patient observed/assessed in dayroom watching TV and consuming snack. Patient alert and oriented x 4. Affect is Bright. Patient denies pain and anxiety. He denies A/V/H. He denies having any thoughts/plan of self harm and harm towards others. Fluid and snack offered. Patient states that appetite has been good throughout the day.  Verbalizes no further complaints at this time. Will continue to monitor and support.

## 2022-09-22 NOTE — ED Notes (Signed)
Patient remains asleep in bed without distress or complaint.  Will monitor.

## 2022-09-23 ENCOUNTER — Encounter (HOSPITAL_COMMUNITY): Payer: Self-pay | Admitting: Psychiatry

## 2022-09-23 DIAGNOSIS — F1910 Other psychoactive substance abuse, uncomplicated: Secondary | ICD-10-CM | POA: Diagnosis not present

## 2022-09-23 NOTE — ED Notes (Signed)
Pt is in the bed sleeping. Respirations are even and unlabored. No acute distress noted. Will continue to monitor for safety. 

## 2022-09-23 NOTE — ED Provider Notes (Signed)
Behavioral Health Progress Note  Date and Time: 09/23/2022 1:57 PM Name: [name] [name] MRN:  [id number]  Subjective:   The patient is a 52 year old male with a history of alcohol use disorder and CAD status post PCI.  Relatively limited psychiatric history documented in the medical record.  Reportedly he has been unemployed living with a friend recently.  He denies previous suicide attempts or self-harm.  Desires residential rehab at [facility], which he went to several years ago.  Referral has been placed by LCSW.  Patient to receive sober living resources and encouraged to call in case he does not get admission to [facility].  Discharge date of Wednesday discussed with the patient and he agrees.  Discharge date of Wednesday discussed with the patient and he agrees.  Patient started on Remeron for mood and sleep.  The patient denies auditory/visual hallucinations and first rank symptoms.  The patient reports good mood, appetite, and sleep. They deny suicidal and homicidal thoughts. The patient denies side effects from their medications.  Review of systems as below. The patient denies experiencing any withdrawal symptoms.   Diagnosis:  Final diagnoses:  Polysubstance abuse (HCC)    Total Time spent with patient: 20 minutes  Past Psychiatric History: as above Past Medical History:  Past Medical History:  Diagnosis Date   Benign essential HTN    CAD (coronary artery disease)    Unstable angina [facility])     Past Surgical History:  Procedure Laterality Date   CARDIAC CATHETERIZATION  05/15/07   with stenting   cardiac stents     Family History: No family history on file. Family Psychiatric  History: none Social History:  Social History   Substance and Sexual Activity  Alcohol Use Yes     Social History   Substance and Sexual Activity  Drug Use Yes   Types: Marijuana   Comment: daily    Social History   Socioeconomic History   Marital status: Married    Spouse name: Not on file    Number of children: Not on file   Years of education: Not on file   Highest education level: Not on file  Occupational History   Not on file  Tobacco Use   Smoking status: Every Day    Packs/day: 1.00    Types: Cigarettes   Smokeless tobacco: Not on file  Substance and Sexual Activity   Alcohol use: Yes   Drug use: Yes    Types: Marijuana    Comment: daily   Sexual activity: Not on file  Other Topics Concern   Not on file  Social History Narrative   Not on file   Social Determinants of Health   Financial Resource Strain: Not on file  Food Insecurity: Not on file  Transportation Needs: Not on file  Physical Activity: Not on file  Stress: Not on file  Social Connections: Not on file   SDOH:  SDOH Screenings   Depression (PHQ2-9): Low Risk  (09/22/2022)  Tobacco Use: High Risk (09/23/2022)   Additional Social History:    Pain Medications: see MAR Prescriptions: see MAR Over the Counter: see MAR History of alcohol / drug use?: Yes Longest period of sobriety (when/how long): unknown Negative Consequences of Use: Legal (DWI) Withdrawal Symptoms: None (None reported) Name of Substance 1: alcohol 1 - Age of First Use: teen 1 - Amount (size/oz): varies 1 - Frequency: daily 1 - Duration: ongoing 1 - Last Use / Amount: yesterday 1 - Method of Aquiring: purchase 1-  Route of Use: drink/oral Name of Substance 2: cannabis 2 - Age of First Use: 22 2 - Amount (size/oz): varies 2 - Frequency: daily 2 - Duration: ongoing 2 - Last Use / Amount: yesterday 2 - Method of Aquiring: unknown 2 - Route of Substance Use: smoke                Sleep: Fair  Appetite:  Fair  Current Medications:  Current Facility-Administered Medications  Medication Dose Route Frequency Provider Last Rate Last Admin   acetaminophen (TYLENOL) tablet 650 mg  650 mg Oral Q6H PRN Lauree ChandlerLee, Jacqueline Eun, NP       alum & mag hydroxide-simeth (MAALOX/MYLANTA) 200-200-20 MG/5ML suspension 30 mL  30 mL  Oral Q4H PRN Lauree ChandlerLee, Jacqueline Eun, NP       aspirin tablet 325 mg  325 mg Oral Daily Lauree ChandlerLee, Jacqueline Eun, NP   325 mg at 09/23/22 [id number]   benzonatate (TESSALON) capsule 200 mg  200 mg Oral BID Eliseo GumMcQuilla, Jai B, MD   200 mg at 09/23/22 [id number]   magnesium hydroxide (MILK OF MAGNESIA) suspension 30 mL  30 mL Oral Daily PRN Lauree ChandlerLee, Jacqueline Eun, NP       mirtazapine (REMERON) tablet 15 mg  15 mg Oral QHS Eliseo GumMcQuilla, Jai B, MD   15 mg at 09/22/22 2106   multivitamin with minerals tablet 1 tablet  1 tablet Oral Daily Lauree ChandlerLee, Jacqueline Eun, NP   1 tablet at 09/23/22 [id number]   neomycin-bacitracin-polymyxin [phone] OINT 1 Application  1 Application Topical BID PRN Eliseo GumMcQuilla, Jai B, MD   1 Application at 09/23/22 [id number]   nicotine (NICODERM CQ - dosed in mg/24 hours) patch 21 mg  21 mg Transdermal Daily Eliseo GumMcQuilla, Jai B, MD   21 mg at 09/23/22 0920   thiamine (VITAMIN B1) injection 100 mg  100 mg Intramuscular Once Lauree ChandlerLee, Jacqueline Eun, NP       thiamine (VITAMIN B1) tablet 100 mg  100 mg Oral Daily Lauree ChandlerLee, Jacqueline Eun, NP   100 mg at 09/23/22 [id number]   traZODone (DESYREL) tablet 50 mg  50 mg Oral QHS Eliseo GumMcQuilla, Jai B, MD   50 mg at 09/22/22 2106   Current Outpatient Medications  Medication Sig Dispense Refill   aspirin 325 MG tablet Take 325 mg by mouth daily.     tetrahydrozoline 0.05 % ophthalmic solution Place 2 drops into both eyes daily as needed (For dry eyes or allergies).      Labs  Lab Results:  Admission on 09/19/2022  Component Date Value Ref Range Status   SARS Coronavirus 2 by RT PCR 09/19/2022 NEGATIVE  NEGATIVE Final   Comment: (NOTE) SARS-CoV-2 target nucleic acids are NOT DETECTED.  The SARS-CoV-2 RNA is generally detectable in upper respiratory specimens during the acute phase of infection. The lowest concentration of SARS-CoV-2 viral copies this assay can detect is 138 copies/mL. A negative result does not preclude SARS-Cov-2 infection and should not be used as the sole basis for treatment  or other patient management decisions. A negative result may occur with  improper specimen collection/handling, submission of specimen other than nasopharyngeal swab, presence of viral mutation(s) within the areas targeted by this assay, and inadequate number of viral copies(<138 copies/mL). A negative result must be combined with clinical observations, patient history, and epidemiological information. The expected result is Negative.  Fact Sheet for Patients:  BloggerCourse.comhttps://www.fda.gov/media/152166/download  Fact Sheet for Healthcare Providers:  SeriousBroker.ithttps://www.fda.gov/media/152162/download  This test is no  t yet approved or cleared by the [place] and  has been authorized for detection and/or diagnosis of SARS-CoV-2 by FDA under an Emergency Use Authorization (EUA). This EUA will remain  in effect (meaning this test can be used) for the duration of the COVID-19 declaration under Section 564(b)(1) of the Act, 21 U.S.C.section 360bbb-3(b)(1), unless the authorization is terminated  or revoked sooner.       Influenza A by PCR 09/19/2022 NEGATIVE  NEGATIVE Final   Influenza B by PCR 09/19/2022 NEGATIVE  NEGATIVE Final   Comment: (NOTE) The Xpert Xpress SARS-CoV-2/FLU/RSV plus assay is intended as an aid in the diagnosis of influenza from Nasopharyngeal swab specimens and should not be used as a sole basis for treatment. Nasal washings and aspirates are unacceptable for Xpert Xpress SARS-CoV-2/FLU/RSV testing.  Fact Sheet for Patients: BloggerCourse.com  Fact Sheet for Healthcare Providers: SeriousBroker.it  This test is not yet approved or cleared by the [place] FDA and has been authorized for detection and/or diagnosis of SARS-CoV-2 by FDA under an Emergency Use Authorization (EUA). This EUA will remain in effect (meaning this test can be used) for the duration of the COVID-19 declaration under  Section 564(b)(1) of the Act, 21 U.S.C. section 360bbb-3(b)(1), unless the authorization is terminated or revoked.     Resp Syncytial Virus by PCR 09/19/2022 NEGATIVE  NEGATIVE Final   Comment: (NOTE) Fact Sheet for Patients: BloggerCourse.com  Fact Sheet for Healthcare Providers: SeriousBroker.it  This test is not yet approved or cleared by the [place] FDA and has been authorized for detection and/or diagnosis of SARS-CoV-2 by FDA under an Emergency Use Authorization (EUA). This EUA will remain in effect (meaning this test can be used) for the duration of the COVID-19 declaration under Section 564(b)(1) of the Act, 21 U.S.C. section 360bbb-3(b)(1), unless the authorization is terminated or revoked.  Performed at [facility], [address], [place], [place] [address]    WBC 09/19/2022 17.8 (H)  4.0 - 10.5 K/uL Final   RBC 09/19/2022 3.98 (L)  4.22 - 5.81 MIL/uL Final   Hemoglobin 09/19/2022 8.9 (L)  13.0 - 17.0 g/dL Final   Comment: Reticulocyte Hemoglobin testing may be clinically indicated, consider ordering this additional test [id number]    HCT 09/19/2022 30.0 (L)  39.0 - 52.0 % Final   MCV 09/19/2022 75.4 (L)  80.0 - 100.0 fL Final   MCH 09/19/2022 22.4 (L)  26.0 - 34.0 pg Final   MCHC 09/19/2022 29.7 (L)  30.0 - 36.0 g/dL Final   RDW 36/14/4315 17.0 (H)  11.5 - 15.5 % Final   Platelets 09/19/2022 350  150 - 400 K/uL Final   nRBC 09/19/2022 0.0  0.0 - 0.2 % Final   Neutrophils Relative % 09/19/2022 75  % Final   Neutro Abs 09/19/2022 13.2 (H)  1.7 - 7.7 K/uL Final   Lymphocytes Relative 09/19/2022 10  % Final   Lymphs Abs 09/19/2022 1.7  0.7 - 4.0 K/uL Final   Monocytes Relative 09/19/2022 15  % Final   Monocytes Absolute 09/19/2022 2.7 (H)  0.1 - 1.0 K/uL Final   Eosinophils Relative 09/19/2022 0  % Final   Eosinophils Absolute 09/19/2022 0.0  0.0 - 0.5 K/uL Final   Basophils Relative 09/19/2022 0  %  Final   Basophils Absolute 09/19/2022 0.1  0.0 - 0.1 K/uL Final   Immature Granulocytes 09/19/2022 0  % Final   Abs Immature Granulocytes 09/19/2022 0.07  0.00 - 0.07 K/uL Final  Performed at [facility], [place] [address], [place], [place] [address]   Sodium 09/19/2022 136  135 - 145 mmol/L Final   Potassium 09/19/2022 3.9  3.5 - 5.1 mmol/L Final   Chloride 09/19/2022 97 (L)  98 - 111 mmol/L Final   CO2 09/19/2022 25  22 - 32 mmol/L Final   Glucose, Bld 09/19/2022 94  70 - 99 mg/dL Final   Glucose reference range applies only to samples taken after fasting for at least 8 hours.   BUN 09/19/2022 10  6 - 20 mg/dL Final   Creatinine, Ser 09/19/2022 1.00  0.61 - 1.24 mg/dL Final   Calcium 09/19/2022 9.2  8.9 - 10.3 mg/dL Final   Total Protein 09/19/2022 7.9  6.5 - 8.1 g/dL Final   Albumin 09/19/2022 3.8  3.5 - 5.0 g/dL Final   AST 09/19/2022 25  15 - 41 U/L Final   ALT 09/19/2022 21  0 - 44 U/L Final   Alkaline Phosphatase 09/19/2022 48  38 - 126 U/L Final   Total Bilirubin 09/19/2022 0.5  0.3 - 1.2 mg/dL Final   GFR, Estimated 09/19/2022 >60  >60 mL/min Final   Comment: (NOTE) Calculated using the CKD-EPI Creatinine Equation (2021)    Anion gap 09/19/2022 14  5 - 15 Final   Performed at [facility] [address], [place], [place] [address]   Alcohol, Ethyl (B) 09/19/2022 54 (H)  <10 mg/dL Final   Comment: (NOTE) Lowest detectable limit for serum alcohol is 10 mg/dL.  For medical purposes only. Performed at [facility], [place] [address], [place], [place] [address]    Hgb A1c MFr Bld 09/19/2022 5.2  4.8 - 5.6 % Final   Comment: (NOTE) Pre diabetes:          5.7%-6.4%  Diabetes:              >6.4%  Glycemic control for   <7.0% adults with diabetes    Mean Plasma Glucose 09/19/2022 102.54  mg/dL Final   Performed at [facility], [place] [address], [place], [place] [address]   Cholesterol 09/19/2022 123  0 - 200 mg/dL Final   Triglycerides 09/19/2022 68   <150 mg/dL Final   HDL 09/19/2022 42  >40 mg/dL Final   Total CHOL/HDL Ratio 09/19/2022 2.9  RATIO Final   VLDL 09/19/2022 14  0 - 40 mg/dL Final   LDL Cholesterol 09/19/2022 67  0 - 99 mg/dL Final   Comment:        Total Cholesterol/HDL:CHD Risk Coronary Heart Disease Risk Table                     Men   Women  1/2 Average Risk   3.4   3.3  Average Risk       5.0   4.4  2 X Average Risk   9.6   7.1  3 X Average Risk  23.4   11.0        Use the calculated Patient Ratio above and the CHD Risk Table to determine the patient's CHD Risk.        ATP III CLASSIFICATION (LDL):  <100     mg/dL   Optimal  100-129  mg/dL   Near or Above                    Optimal  130-159  mg/dL   Borderline  160-189  mg/dL  High  >190     mg/dL   Very High Performed at [facility] [address] [place], [place] [address]    POC Amphetamine UR 09/19/2022 None Detected  NONE DETECTED (Cut Off Level 1000 ng/mL) Final   POC Secobarbital (BAR) 09/19/2022 None Detected  NONE DETECTED (Cut Off Level 300 ng/mL) Final   POC Buprenorphine (BUP) 09/19/2022 None Detected  NONE DETECTED (Cut Off Level 10 ng/mL) Final   POC Oxazepam (BZO) 09/19/2022 None Detected  NONE DETECTED (Cut Off Level 300 ng/mL) Final   POC Cocaine UR 09/19/2022 None Detected  NONE DETECTED (Cut Off Level 300 ng/mL) Final   POC Methamphetamine UR 09/19/2022 None Detected  NONE DETECTED (Cut Off Level 1000 ng/mL) Final   POC Morphine 09/19/2022 None Detected  NONE DETECTED (Cut Off Level 300 ng/mL) Final   POC Methadone UR 09/19/2022 None Detected  NONE DETECTED (Cut Off Level 300 ng/mL) Final   POC Oxycodone UR 09/19/2022 None Detected  NONE DETECTED (Cut Off Level 100 ng/mL) Final   POC Marijuana UR 09/19/2022 Positive (A)  NONE DETECTED (Cut Off Level 50 ng/mL) Final   WBC 09/21/2022 11.9 (H)  4.0 - 10.5 K/uL Final   RBC 09/21/2022 4.32  4.22 - 5.81 MIL/uL Final   Hemoglobin 09/21/2022 9.4 (L)  13.0 - 17.0 g/dL Final   HCT  09/21/2022 33.9 (L)  39.0 - 52.0 % Final   MCV 09/21/2022 78.5 (L)  80.0 - 100.0 fL Final   MCH 09/21/2022 21.8 (L)  26.0 - 34.0 pg Final   MCHC 09/21/2022 27.7 (L)  30.0 - 36.0 g/dL Final   RDW 09/21/2022 17.1 (H)  11.5 - 15.5 % Final   Platelets 09/21/2022 458 (H)  150 - 400 K/uL Final   nRBC 09/21/2022 0.0  0.0 - 0.2 % Final   Neutrophils Relative % 09/21/2022 55  % Final   Neutro Abs 09/21/2022 6.5  1.7 - 7.7 K/uL Final   Lymphocytes Relative 09/21/2022 27  % Final   Lymphs Abs 09/21/2022 3.3  0.7 - 4.0 K/uL Final   Monocytes Relative 09/21/2022 14  % Final   Monocytes Absolute 09/21/2022 1.7 (H)  0.1 - 1.0 K/uL Final   Eosinophils Relative 09/21/2022 3  % Final   Eosinophils Absolute 09/21/2022 0.4  0.0 - 0.5 K/uL Final   Basophils Relative 09/21/2022 1  % Final   Basophils Absolute 09/21/2022 0.1  0.0 - 0.1 K/uL Final   Immature Granulocytes 09/21/2022 0  % Final   Abs Immature Granulocytes 09/21/2022 0.05  0.00 - 0.07 K/uL Final   Performed at [facility], [place] [address], [place], [place] [address]    Blood Alcohol level:  Lab Results  Component Value Date   ETH 54 (H) 09/19/2022   ETH 213 (H) 47/82/9562    Metabolic Disorder Labs: Lab Results  Component Value Date   HGBA1C 5.2 09/19/2022   MPG 102.54 09/19/2022   No results found for: "PROLACTIN" Lab Results  Component Value Date   CHOL 123 09/19/2022   TRIG 68 09/19/2022   HDL 42 09/19/2022   CHOLHDL 2.9 09/19/2022   VLDL 14 09/19/2022   LDLCALC 67 09/19/2022   LDLCALC 101 (H) 11/11/2007    Therapeutic Lab Levels: No results found for: "LITHIUM" No results found for: "VALPROATE" No results found for: "CBMZ"  Physical Findings   PHQ2-9    Flowsheet Row ED from 09/19/2022 in [facility] Documentation from 02/25/2019 in [facility] Documentation from 02/11/2019 in [facility] Documentation from 01/13/2019 in [facility]  Documentation from 01/06/2019 in [facility]  PHQ-2 Total Score 0 0 0 0 0  PHQ-9 Total Score -- 0 0 0 0      Flowsheet Row ED from 09/19/2022 in [facility] ED from 02/23/2021 in [facility] RISK CATEGORY No Risk Error: Question 6 not populated        Musculoskeletal  Strength & Muscle Tone: within normal limits Gait & Station: normal Patient leans: N/A  Psychiatric Specialty Exam: Physical Exam Constitutional:      Appearance: the patient is not toxic-appearing.  Pulmonary:     Effort: Pulmonary effort is normal.  Neurological:     General: No focal deficit present.     Mental Status: the patient is alert and oriented to person, place, and time.   Review of Systems  Respiratory:  Negative for shortness of breath.   Cardiovascular:  Negative for chest pain.  Gastrointestinal:  Negative for abdominal pain, constipation, diarrhea, nausea and vomiting.  Neurological:  Negative for headaches.      BP (!) 154/84 (BP Location: Right Arm)   Pulse 97   Temp 97.9 F (36.6 C) (Oral)   Resp 18   Ht 5\' 6"  (1.676 m)   Wt 170 lb (77.1 kg)   SpO2 100%   BMI 27.44 kg/m   General Appearance: Fairly Groomed  Eye Contact:  Good  Speech:  Clear and Coherent  Volume:  Normal  Mood:  Euthymic  Affect:  Congruent  Thought Process:  Coherent  Orientation:  Full (Time, Place, and Person)  Thought Content: Logical   Suicidal Thoughts:  No  Homicidal Thoughts:  No  Memory:  Immediate;   Good  Judgement:  fair  Insight:  fair  Psychomotor Activity:  Normal  Concentration:  Concentration: Good  Recall:  Good  Fund of Knowledge: Good  Language: Good  Akathisia:  No  Handed:    AIMS (if indicated): not done  Assets:  Communication Skills Desire for Improvement Leisure Time Physical Health  ADL's:  Intact  Cognition: WNL  Sleep:  Fair   Treatment Plan Summary: Daily contact with patient to assess and  evaluate symptoms and progress in treatment and Medication management  Status: Voluntary, no acute safety concerns may be discharged if he wishes to leave AMA  Alcohol use disorder, no present withdrawal, no history of alcohol withdrawal syndrome - CIWA with as needed Ativan - Remeron 15 mg nightly for depression  CAD status post PCI - Reported by the patient - Continue aspirin 325 mg daily  Medical: - Leukocytosis and anemia, both of which appear new.  ED physician left a note stating the patient does not require urgent medical attention.   -Labs improved and stable  , MD 09/23/2022 1:57 PM

## 2022-09-23 NOTE — ED Notes (Signed)
Patient observed/assessed in room in bed appearing in no immediate distress resting peacefully. Q15 minute checks continued by MHT and nursing staff. Will continue to monitor and support. 

## 2022-09-23 NOTE — Discharge Planning (Signed)
LCSW spoke with patient on this morning to provide update regarding referral for [facility]. LCSW is still awaiting response regarding referral and has attempted multiple times to follow up. Patient reports his current goal is to be admitted there and he would not like to follow up with anywhere else until he hears back from there. Patient reports he was sent to the [facility] from [facility], and was just advised to detox prior to admitting to their facility. LCSW expressed understanding and provided encouragement to the patient to follow up with list of resources provided for a follow up plan. Once LCSW has been provided an update, patient will be informed. Patient expressed understanding and stated, "I want to wait on [facility] because I know they will accept me".   LCSW will continue to follow and provide support to patient while on [facility] unit. Patient does have resources for sober living and long-term residential facilities.   Lucius Conn, LCSW Clinical Social Worker [facility] Ph: [phone]

## 2022-09-23 NOTE — ED Notes (Addendum)
Patient A&Ox4. Denies intent to harm self/others when asked. Denies A/VH. Patient denies any physical complaints when asked  but easily agitated to questions being asked. Labile behaviors noted. Routine safety checks conducted according to facility protocol. Encouraged patient to notify staff if thoughts of harm toward self or others arise. Patient verbalize understanding and agreement. Will continue to monitor for safety.

## 2022-09-23 NOTE — ED Notes (Signed)
Pt asleep in bed. Respirations even and unlabored. Monitoring for safety. 

## 2022-09-23 NOTE — ED Notes (Signed)
Pt at nurses station interacting with staff. No acute distress noted. No concerns voiced. Informed pt to notify staff with any needs or assistance. Pt verbalized understanding or agreement. Will continue to monitor for safety.

## 2022-09-23 NOTE — ED Notes (Signed)
Pt is in the dayroom watching TV.  Respirations are even and unlabored. No acute distress noted. Will continue to monitor for safety. 

## 2022-09-23 NOTE — ED Notes (Signed)
Pt sleeping in no acute distress. RR even and unlabored. Environment secured. Will continue to monitor for safety. 

## 2022-09-24 DIAGNOSIS — F1910 Other psychoactive substance abuse, uncomplicated: Secondary | ICD-10-CM | POA: Diagnosis not present

## 2022-09-24 MED ORDER — ASPIRIN 325 MG PO TABS: 325.0000 mg | ORAL_TABLET | Freq: Every day | ORAL | 0 refills | Status: DC

## 2022-09-24 MED ORDER — NICOTINE POLACRILEX 2 MG MT GUM: 2.0000 mg | CHEWING_GUM | OROMUCOSAL | Status: DC

## 2022-09-24 MED ORDER — TRAZODONE HCL 50 MG PO TABS: 50.0000 mg | ORAL_TABLET | Freq: Every evening | ORAL | 0 refills | Status: DC | PRN

## 2022-09-24 MED ORDER — NICOTINE POLACRILEX 2 MG MT GUM: 4.0000 mg | CHEWING_GUM | OROMUCOSAL | Status: DC

## 2022-09-24 MED ORDER — NICOTINE 21 MG/24HR TD PT24: 21.0000 mg | MEDICATED_PATCH | Freq: Every day | TRANSDERMAL | 0 refills | Status: DC

## 2022-09-24 MED ORDER — NICOTINE POLACRILEX 2 MG MT GUM
4.0000 mg | CHEWING_GUM | OROMUCOSAL | Status: DC | PRN
  Administered 2022-09-24: 4 mg via ORAL
  Filled 2022-09-24: qty 2

## 2022-09-24 MED ORDER — MIRTAZAPINE 15 MG PO TABS: 15.0000 mg | ORAL_TABLET | Freq: Every day | ORAL | 0 refills | Status: DC

## 2022-09-24 MED ORDER — NICOTINE POLACRILEX 4 MG MT GUM: 4.0000 mg | CHEWING_GUM | OROMUCOSAL | 0 refills | Status: AC | PRN

## 2022-09-24 NOTE — ED Provider Notes (Signed)
FBC/OBS ASAP Discharge Summary  Date and Time: 09/24/2022 1:58 PM  Name: [name] [name]  MRN:  [id number]   Discharge Diagnoses:  Final diagnoses:  Polysubstance abuse (HCC)    Subjective:  The patient is a 52 year old male with a history of alcohol use disorder and CAD status post PCI.  Relatively limited psychiatric history documented in the medical record.  Reportedly he has been unemployed living with a friend recently.  He denies previous suicide attempts or self-harm.   Stay Summary:  Uneventful detox, no alcohol withdrawal symptoms. Found placement at [facility] residential treatment. Started on Remeron for mood and sleep.  Total Time spent with patient: 20 min  Past Psychiatric History: as above Past Medical History: per H and P Family History: none Family Psychiatric History: none Social History: per H and P Tobacco Cessation:  A prescription for an FDA-approved tobacco cessation medication provided at discharge   Current Medications:  Current Facility-Administered Medications  Medication Dose Route Frequency Provider Last Rate Last Admin   acetaminophen (TYLENOL) tablet 650 mg  650 mg Oral Q6H PRN Lauree Chandler, NP       alum & mag hydroxide-simeth (MAALOX/MYLANTA) 200-200-20 MG/5ML suspension 30 mL  30 mL Oral Q4H PRN Lauree Chandler, NP       aspirin tablet 325 mg  325 mg Oral Daily Lauree Chandler, NP   325 mg at 09/24/22 4401   benzonatate (TESSALON) capsule 200 mg  200 mg Oral BID Eliseo Gum B, MD   200 mg at 09/24/22 0912   magnesium hydroxide (MILK OF MAGNESIA) suspension 30 mL  30 mL Oral Daily PRN Lauree Chandler, NP       mirtazapine (REMERON) tablet 15 mg  15 mg Oral QHS Eliseo Gum B, MD   15 mg at 09/23/22 2108   multivitamin with minerals tablet 1 tablet  1 tablet Oral Daily Lauree Chandler, NP   1 tablet at 09/24/22 0912   neomycin-bacitracin-polymyxin [phone] OINT 1 Application  1 Application Topical BID PRN Eliseo Gum B,  MD   1 Application at 09/24/22 0929   nicotine (NICODERM CQ - dosed in mg/24 hours) patch 21 mg  21 mg Transdermal Daily Eliseo Gum B, MD   21 mg at 09/24/22 0911   nicotine polacrilex (NICORETTE) gum 4 mg  4 mg Oral Q4H PRN Carlyn Reichert, MD   4 mg at 09/24/22 1135   thiamine (VITAMIN B1) tablet 100 mg  100 mg Oral Daily Lauree Chandler, NP   100 mg at 09/24/22 0912   traZODone (DESYREL) tablet 50 mg  50 mg Oral QHS Eliseo Gum B, MD   50 mg at 09/23/22 2109   Current Outpatient Medications  Medication Sig Dispense Refill   aspirin 325 MG tablet Take 1 tablet (325 mg total) by mouth daily. 30 tablet 0   mirtazapine (REMERON) 15 MG tablet Take 1 tablet (15 mg total) by mouth at bedtime. 30 tablet 0   [START ON 09/25/2022] nicotine (NICODERM CQ - DOSED IN MG/24 HOURS) 21 mg/24hr patch Place 1 patch (21 mg total) onto the skin daily. 28 patch 0   nicotine polacrilex (NICORETTE) 4 MG gum Take 1 each (4 mg total) by mouth every 4 (four) hours as needed for smoking cessation. 100 tablet 0   traZODone (DESYREL) 50 MG tablet Take 1 tablet (50 mg total) by mouth at bedtime as needed for sleep. 30 tablet 0    PTA Medications:  Facility Ordered Medications  Medication   acetaminophen (TYLENOL) tablet 650 mg   alum & mag hydroxide-simeth (MAALOX/MYLANTA) 200-200-20 MG/5ML suspension 30 mL   magnesium hydroxide (MILK OF MAGNESIA) suspension 30 mL   thiamine (VITAMIN B1) tablet 100 mg   multivitamin with minerals tablet 1 tablet   [EXPIRED] LORazepam (ATIVAN) tablet 1 mg   [EXPIRED] hydrOXYzine (ATARAX) tablet 25 mg   [EXPIRED] loperamide (IMODIUM) capsule 2-4 mg   [EXPIRED] ondansetron (ZOFRAN-ODT) disintegrating tablet 4 mg   aspirin tablet 325 mg   neomycin-bacitracin-polymyxin [phone] OINT 1 Application   nicotine (NICODERM CQ - dosed in mg/24 hours) patch 21 mg   mirtazapine (REMERON) tablet 15 mg   benzonatate (TESSALON) capsule 200 mg   traZODone (DESYREL) tablet 50 mg    nicotine polacrilex (NICORETTE) gum 4 mg   PTA Medications  Medication Sig   aspirin 325 MG tablet Take 1 tablet (325 mg total) by mouth daily.   mirtazapine (REMERON) 15 MG tablet Take 1 tablet (15 mg total) by mouth at bedtime.   [START ON 09/25/2022] nicotine (NICODERM CQ - DOSED IN MG/24 HOURS) 21 mg/24hr patch Place 1 patch (21 mg total) onto the skin daily.   nicotine polacrilex (NICORETTE) 4 MG gum Take 1 each (4 mg total) by mouth every 4 (four) hours as needed for smoking cessation.   traZODone (DESYREL) 50 MG tablet Take 1 tablet (50 mg total) by mouth at bedtime as needed for sleep.       09/22/2022   10:48 AM 09/21/2022   11:51 AM 09/20/2022    2:07 PM  Depression screen PHQ 2/9  Decreased Interest 0 0 0  Down, Depressed, Hopeless 0 0 0  PHQ - 2 Score 0 0 0    Flowsheet Row ED from 09/19/2022 in [facility] ED from 02/23/2021 in [facility] [place] No Risk Error: Question 6 not populated      Musculoskeletal  Strength & Muscle Tone: within normal limits Gait & Station: normal Patient leans: N/A  Psychiatric Specialty Exam: Physical Exam Constitutional:      Appearance: the patient is not toxic-appearing.  Pulmonary:     Effort: Pulmonary effort is normal.  Neurological:     General: No focal deficit present.     Mental Status: the patient is alert and oriented to person, place, and time.   Review of Systems  Respiratory:  Negative for shortness of breath.   Cardiovascular:  Negative for chest pain.  Gastrointestinal:  Negative for abdominal pain, constipation, diarrhea, nausea and vomiting.  Neurological:  Negative for headaches.      BP 135/87 (BP Location: Right Arm)   Pulse 91   Temp 98.5 F (36.9 C) (Tympanic)   Resp 18   Ht 5\' 6"  (1.676 m)   Wt 170 lb (77.1 kg)   SpO2 100%   BMI 27.44 kg/m   General Appearance: Fairly Groomed  Eye Contact:  Good  Speech:  Clear and Coherent   Volume:  Normal  Mood:  Euthymic  Affect:  Congruent  Thought Process:  Coherent  Orientation:  Full (Time, Place, and Person)  Thought Content: Logical   Suicidal Thoughts:  No  Homicidal Thoughts:  No  Memory:  Immediate;   Good  Judgement:  fair  Insight:  fair  Psychomotor Activity:  Normal  Concentration:  Concentration: Good  Recall:  Good  Fund of Knowledge: Good  Language: Good  Akathisia:  No  Handed:  AIMS (if indicated): not done  Assets:  Communication Skills Desire for Improvement Financial Resources/Insurance Housing Leisure Time Physical Health  ADL's:  Intact  Cognition: WNL  Sleep:  Fair   Demographic Factors:  Low socioeconomic status   Loss Factors: NA   Historical Factors: NA   Risk Reduction Factors:   Positive social support, Positive therapeutic relationship, and Positive coping skills or problem solving skills   Continued Clinical Symptoms:  Alcohol/Substance Abuse/Dependencies   Cognitive Features That Contribute To Risk:  None     Suicide Risk:  Mild: Patient did not present with intentional self-harm behavior.  Has demonstrated good engagement in the treatment plan and future oriented thought process.  Good follow-up plan in place.   Plan Of Care/Follow-up recommendations:  Activity as tolerated. Diet as recommended by PCP. Keep all scheduled follow-up appointments as recommended.   Patient is instructed to take all prescribed medications as recommended. Report any side effects or adverse reactions to your outpatient psychiatrist. Patient is instructed to abstain from alcohol and illegal drugs while on prescription medications. In the event of worsening symptoms, patient is instructed to call the crisis hotline, 911, or go to the nearest emergency department for evaluation and treatment.   Prescriptions given at discharge. Patient agreeable to plan. Given opportunity to ask questions. Appears to feel comfortable with discharge.    Patient is also instructed prior to discharge to: Take all medications as prescribed by mental healthcare provider. Report any adverse effects and or reactions from the medicines to outpatient provider promptly. Patient has been instructed & cautioned: To not engage in alcohol and or illegal drug use while on prescription medicines. In the event of worsening symptoms,  patient is instructed to call the crisis hotline, 911 and or go to the nearest ED for appropriate evaluation and treatment of symptoms. To follow-up with primary care provider for other medical issues, concerns and or health care needs   The patient was evaluated each day by a clinical provider to ascertain response to treatment. Improvement was noted by the patient's report of decreasing symptoms, improved sleep and appetite, affect, medication tolerance, behavior, and participation in unit programming.  Patient was asked each day to complete a self inventory noting mood, mental status, pain, new symptoms, anxiety and concerns.   Patient responded well to medication and being in a therapeutic and supportive environment. Positive and appropriate behavior was noted and the patient was motivated for recovery. The patient worked closely with the treatment team and case manager to develop a discharge plan with appropriate goals. Coping skills, problem solving as well as relaxation therapies were also part of the unit programming.   By the day of discharge patient was in much improved condition than upon admission.  Symptoms were reported as significantly decreased or resolved completely. The patient was motivated to continue taking medication with a goal of continued improvement in mental health.   Disposition: [facility]   Corky Sox, MD 09/24/2022, 1:58 PM

## 2022-09-24 NOTE — ED Notes (Signed)
Pt sleeping in no acute distress. RR even and unlabored. Environment secured. Will continue to monitor for safety. 

## 2022-09-24 NOTE — ED Notes (Signed)
Pt is in the dayroom watching TV.  Respirations are even and unlabored. No acute distress noted. Will continue to monitor for safety. 

## 2022-09-24 NOTE — Discharge Instructions (Addendum)
[facility] [address] [place], [place], [address] [phone] phone  [facility] [address] [place], [place], [address] [phone] phone   New Patient Assessment/Therapy Walk-Ins:  Monday and Wednesday: 8 am until slots are full. Every 1st and 2nd Fridays of the month: 1 pm - 5 pm.  NO ASSESSMENT/THERAPY WALK-INS ON TUESDAYS OR THURSDAYS  New Patient Assessment/Medication Management Walk-Ins:  Monday - Friday:  8 am - 11 am.  For all walk-ins, we ask that you arrive by 7:30 am because patients will be seen in the order of arrival.  Availability is limited; therefore, you may not be seen on the same day that you walk-in.  Our goal is to serve and meet the needs of our community to the best of our ability.  SUBSTANCE USE TREATMENT for Medicaid and State Funded/IPRS  [facility] [address] [place], [place], [address] [phone] phone NOTE: [facility] is no longer offering IOP services.  Serves those who are low-income or have no insurance.  [facility] [address], [place], [place], [address] [phone] phone [phone] fax NOTE: Does have Substance Abuse-Intensive Outpatient Program (SAIOP) as well as transitional housing if eligible.  [facility] [address] [place], [place], [address] [phone] phone [phone] fax  [facility] [address] [place], [place], [address] [phone] phone [phone] fax  HALFWAY HOUSES:  [facility] [phone]  [facility] www.oxfordvacancies.com  12 STEP PROGRAMS:  Alcoholics Anonymous of [place] https://aagreensboronc.com/meeting  Narcotics Anonymous of [place] https://greensborona.org/meetings/  Al-Anon of  [place], [place] www.greens[place]alanon.org/find-meetings.html  [place]-Anon https://nar-anon.org/find-a-meetin 

## 2022-09-24 NOTE — ED Notes (Signed)
Pt is in the bed sleeping. Respirations are even and unlabored. No acute distress noted. Will continue to monitor for safety. 

## 2022-09-24 NOTE — ED Notes (Signed)
Pt had bedtime snack.

## 2022-09-24 NOTE — ED Notes (Signed)
Patient came to group we discussed how the patient can work on getting better by believing in themselves, and working on getting better, taking their medication and finding other things of interest that would occupy their minds and take away the cravings. We discussed empowerment and how they have it within themselves to make the changes that are needed we discussed their problems and the things that brought them here. We discussed ways we can work on to make it better for them. We discussed removing negative people from their lives and filling it with positive people who can lift them up and help them with their recovery. We discussed the long road back to a life of less pain, and more happiness. The patient understands it will not be easy but they have made the first step and it is possible to go all the way. The patient enjoyed the group. 

## 2022-09-24 NOTE — ED Notes (Signed)
Patient accepted scheduled meds w/o difficulty. Breakfast provided. Patient requested to have nicotine gum along with the nicotine patch, provider informed of request. Patient currently in day room watching tv and conversing with other patients. Safety maintained and will continue to monitor.

## 2022-09-24 NOTE — Discharge Planning (Signed)
Referral was received and per Kiera, patient has been accepted and can transfer to the facility on tomorrow 01/24 by 9:00am. Update has been provided to the patient and MD made aware. Patient will need a 14-30 day supply of medication and one month refill. No nicotine gum allowed, however 14-30 day nicotine patches to be provided if needed. No other needs to report at this time.    LCSW will continue to follow up and provide updates as received.    [name] Nace, LCSW Clinical Social Worker [facility] Ph: [phone]   

## 2022-09-25 DIAGNOSIS — F1910 Other psychoactive substance abuse, uncomplicated: Secondary | ICD-10-CM | POA: Diagnosis not present

## 2022-09-25 NOTE — ED Notes (Signed)
Patient A&O x 4, ambulatory. Patient discharged in no acute distress. Patient denied SI/HI, A/VH upon discharge. Patient verbalized understanding of all discharge instructions explained by staff, to include follow up appointments, RX's and safety plan. Patient reported mood 10/10.  Pt belongings returned to patient from locker #13  intact. Patient escorted to lobby via staff for transport to destination via [facility] Safety maintained.

## 2022-09-25 NOTE — ED Notes (Signed)
Pt is in the bed sleeping. Respirations are even and unlabored. No acute distress noted. Will continue to monitor for safety. 

## 2023-01-06 ENCOUNTER — Ambulatory Visit: Payer: BLUE CROSS/BLUE SHIELD | Admitting: Physician Assistant

## 2023-01-06 ENCOUNTER — Encounter: Payer: Self-pay | Admitting: Physician Assistant

## 2023-01-06 VITALS — BP 137/75 | HR 112 | Ht 66.0 in | Wt 236.0 lb

## 2023-01-06 DIAGNOSIS — R000 Tachycardia, unspecified: Secondary | ICD-10-CM

## 2023-01-06 DIAGNOSIS — Z720 Tobacco use: Secondary | ICD-10-CM

## 2023-01-06 DIAGNOSIS — F5104 Psychophysiologic insomnia: Secondary | ICD-10-CM | POA: Diagnosis not present

## 2023-01-06 DIAGNOSIS — F411 Generalized anxiety disorder: Secondary | ICD-10-CM

## 2023-01-06 DIAGNOSIS — J302 Other seasonal allergic rhinitis: Secondary | ICD-10-CM

## 2023-01-06 DIAGNOSIS — F17210 Nicotine dependence, cigarettes, uncomplicated: Secondary | ICD-10-CM | POA: Diagnosis not present

## 2023-01-06 DIAGNOSIS — Z125 Encounter for screening for malignant neoplasm of prostate: Secondary | ICD-10-CM

## 2023-01-06 DIAGNOSIS — F1010 Alcohol abuse, uncomplicated: Secondary | ICD-10-CM

## 2023-01-06 DIAGNOSIS — Z955 Presence of coronary angioplasty implant and graft: Secondary | ICD-10-CM

## 2023-01-06 DIAGNOSIS — D649 Anemia, unspecified: Secondary | ICD-10-CM

## 2023-01-06 DIAGNOSIS — I10 Essential (primary) hypertension: Secondary | ICD-10-CM

## 2023-01-06 DIAGNOSIS — F1210 Cannabis abuse, uncomplicated: Secondary | ICD-10-CM

## 2023-01-06 MED ORDER — MIRTAZAPINE 15 MG PO TABS: 15.0000 mg | ORAL_TABLET | Freq: Every day | ORAL | 1 refills | Status: AC

## 2023-01-06 MED ORDER — LORATADINE 10 MG PO TABS: 10.0000 mg | ORAL_TABLET | Freq: Every day | ORAL | 1 refills | Status: AC

## 2023-01-06 MED ORDER — MULTIVITAMIN PO TABS: 1.0000 | ORAL_TABLET | Freq: Every day | ORAL | 1 refills | Status: AC

## 2023-01-06 MED ORDER — VISINE DRY EYE RELIEF 1 % OP SOLN: OPHTHALMIC | 1 refills | Status: AC

## 2023-01-06 MED ORDER — NICOTINE 21 MG/24HR TD PT24: 21.0000 mg | MEDICATED_PATCH | Freq: Every day | TRANSDERMAL | 1 refills | Status: AC

## 2023-01-06 MED ORDER — TRAZODONE HCL 50 MG PO TABS: 50.0000 mg | ORAL_TABLET | Freq: Every evening | ORAL | 1 refills | Status: AC | PRN

## 2023-01-06 MED ORDER — ASPIRIN 325 MG PO TABS: 325.0000 mg | ORAL_TABLET | Freq: Every day | ORAL | 1 refills | Status: AC

## 2023-01-06 NOTE — Patient Instructions (Addendum)
According to the medical records, you are at [facility] health from September 19, 2022 through September 25, 2022.  It is important that you have follow-up labs completed to evaluate anemia and tachycardia.  I encourage you to follow-up with a primary care provider or return to the mobile unit as able.  Your blood pressure is elevated, I encourage you to check your blood pressure on a daily basis, keep a written log and have available for all office visits.  Roney Jaffe, PA-C Physician Assistant [facility] [email or web address]   How to Take Your Blood Pressure Blood pressure is a measurement of how strongly your blood is pressing against the walls of your arteries. Arteries are blood vessels that carry blood from your heart throughout your body. Your health care provider takes your blood pressure at each office visit. You can also take your own blood pressure at home with a blood pressure monitor. You may need to take your own blood pressure to: Confirm a diagnosis of high blood pressure (hypertension). Monitor your blood pressure over time. Make sure your blood pressure medicine is working. Supplies needed: Blood pressure monitor. A chair to sit in. This should be a chair where you can sit upright with your back supported. Do not sit on a soft couch or an armchair. Table or desk. Small notebook and pencil or pen. How to prepare To get the most accurate reading, avoid the following for 30 minutes before you check your blood pressure: Drinking caffeine. Drinking alcohol. Eating. Smoking. Exercising. Five minutes before you check your blood pressure: Use the bathroom and urinate so that you have an empty bladder. Sit quietly in a chair. Do not talk. How to take your blood pressure To check your blood pressure, follow the instructions in the manual that came with your blood pressure monitor. If you have a digital  blood pressure monitor, the instructions may be as follows: Sit up straight in a chair. Place your feet on the floor. Do not cross your ankles or legs. Rest your left arm at the level of your heart on a table or desk or on the arm of a chair. Pull up your shirt sleeve. Wrap the blood pressure cuff around the upper part of your left arm, 1 inch (2.5 cm) above your elbow. It is best to wrap the cuff around bare skin. Fit the cuff snugly, but not too tightly, around your arm. You should be able to place only one finger between the cuff and your arm. Position the cord so that it rests in the bend of your elbow. Press the power button. Sit quietly while the cuff inflates and deflates. Read the digital reading on the monitor screen and write the numbers down (record them) in a notebook. Wait 2-3 minutes, then repeat the steps, starting at step 1. What does my blood pressure reading mean? A blood pressure reading consists of a higher number over a lower number. Ideally, your blood pressure should be below 120/80. The first ("top") number is called the systolic pressure. It is a measure of the pressure in your arteries as your heart beats. The second ("bottom") number is called the diastolic pressure. It is a measure of the pressure in your arteries as the heart relaxes. Blood pressure is classified into four stages. The following are the stages for adults who do not have a short-term serious illness or a chronic condition. Systolic pressure and diastolic pressure are measured in a unit called mm Hg (  millimeters of mercury).  Normal Systolic pressure: below 120. Diastolic pressure: below 80. Elevated Systolic pressure: 120-129. Diastolic pressure: below 80. Hypertension stage 1 Systolic pressure: 130-139. Diastolic pressure: 80-89. Hypertension stage 2 Systolic pressure: 140 or above. Diastolic pressure: 90 or above. You can have elevated blood pressure or hypertension even if only the systolic or  only the diastolic number in your reading is higher than normal. Follow these instructions at home: Medicines Take over-the-counter and prescription medicines only as told by your health care provider. Tell your health care provider if you are having any side effects from blood pressure medicine. General instructions Check your blood pressure as often as recommended by your health care provider. Check your blood pressure at the same time every day. Take your monitor to the next appointment with your health care provider to make sure that: You are using it correctly. It provides accurate readings. Understand what your goal blood pressure numbers are. Keep all follow-up visits. This is important. General tips Your health care provider can suggest a reliable monitor that will meet your needs. There are several types of home blood pressure monitors. Choose a monitor that has an arm cuff. Do not choose a monitor that measures your blood pressure from your wrist or finger. Choose a cuff that wraps snugly, not too tight or too loose, around your upper arm. You should be able to fit only one finger between your arm and the cuff. You can buy a blood pressure monitor at most drugstores or online. Where to find more information American Heart Association: www.heart.org Contact a health care provider if: Your blood pressure is consistently high. Your blood pressure is suddenly low. Get help right away if: Your systolic blood pressure is higher than 180. Your diastolic blood pressure is higher than 120. These symptoms may be an emergency. Get help right away. Call 911. Do not wait to see if the symptoms will go away. Do not drive yourself to the hospital. Summary Blood pressure is a measurement of how strongly your blood is pressing against the walls of your arteries. A blood pressure reading consists of a higher number over a lower number. Ideally, your blood pressure should be below  120/80. Check your blood pressure at the same time every day. Avoid caffeine, alcohol, smoking, and exercise for 30 minutes prior to checking your blood pressure. These agents can affect the accuracy of the blood pressure reading. This information is not intended to replace advice given to you by your health care provider. Make sure you discuss any questions you have with your health care provider. Document Revised: 05/03/2021 Document Reviewed: 05/03/2021 Elsevier Patient Education  2023 [employer].

## 2023-01-06 NOTE — Progress Notes (Signed)
 New Patient Office Visit  Subjective    Patient ID: [name] [name], male    DOB: 05/04/1971  Age: 52 y.o. MRN: [id number]  CC:  Chief Complaint  Patient presents with   Medication Refill         HPI BURNICE OESTREICHER states that he is currently being treated for substance abuse at [facility] residential treatment center.  That he arrived on December 11, 2022 and will be returning to his home in [place] on Thursday.  He has not previously been treated for heart tension, does endorse that he takes aspirin  on a daily basis due to receiving cardiac stent approximately 10 years ago that he does not check his blood pressure at home.  Patient is reluctant historian     Outpatient Encounter Medications as of 01/06/2023  Medication Sig   nicotine  polacrilex (NICORETTE ) 4 MG gum Take 1 each (4 mg total) by mouth every 4 (four) hours as needed for smoking cessation.   Polyethylene Glycol 400 (VISINE  DRY EYE RELIEF) 1 % SOLN One drop in each eye q6hrs PRN   [DISCONTINUED] aspirin  325 MG tablet Take 1 tablet (325 mg total) by mouth daily.   [DISCONTINUED] loratadine  (CLARITIN ) 10 MG tablet Take 10 mg by mouth daily.   [DISCONTINUED] mirtazapine  (REMERON ) 15 MG tablet Take 1 tablet (15 mg total) by mouth at bedtime.   [DISCONTINUED] Multiple Vitamin (MULTIVITAMIN) TABS Take 1 tablet by mouth daily.   [DISCONTINUED] nicotine  (NICODERM CQ  - DOSED IN MG/24 HOURS) 21 mg/24hr patch Place 1 patch (21 mg total) onto the skin daily.   [DISCONTINUED] traZODone  (DESYREL ) 50 MG tablet Take 1 tablet (50 mg total) by mouth at bedtime as needed for sleep.   aspirin  325 MG tablet Take 1 tablet (325 mg total) by mouth daily.   loratadine  (CLARITIN ) 10 MG tablet Take 1 tablet (10 mg total) by mouth daily.   mirtazapine  (REMERON ) 15 MG tablet Take 1 tablet (15 mg total) by mouth at bedtime.   Multiple Vitamin (MULTIVITAMIN) TABS Take 1 tablet by mouth daily.   nicotine  (NICODERM CQ  - DOSED IN MG/24 HOURS) 21  mg/24hr patch Place 1 patch (21 mg total) onto the skin daily.   traZODone  (DESYREL ) 50 MG tablet Take 1 tablet (50 mg total) by mouth at bedtime as needed for sleep.   No facility-administered encounter medications on file as of 01/06/2023.    Past Medical History:  Diagnosis Date   Benign essential HTN    CAD (coronary artery disease)    Unstable angina [facility])     Past Surgical History:  Procedure Laterality Date   CARDIAC CATHETERIZATION  05/15/07   with stenting   cardiac stents      History reviewed. No pertinent family history.  Social History   Socioeconomic History   Marital status: Married    Spouse name: Not on file   Number of children: Not on file   Years of education: Not on file   Highest education level: Not on file  Occupational History   Not on file  Tobacco Use   Smoking status: Every Day    Packs/day: 1    Types: Cigarettes   Smokeless tobacco: Not on file  Substance and Sexual Activity   Alcohol use: Yes   Drug use: Yes    Types: Marijuana    Comment: daily   Sexual activity: Not on file  Other Topics Concern   Not on file  Social History Narrative   Not on  file   Social Determinants of Health   Financial Resource Strain: Not on file  Food Insecurity: Not on file  Transportation Needs: Not on file  Physical Activity: Not on file  Stress: Not on file  Social Connections: Not on file  Intimate Partner Violence: Not on file    Review of Systems  Constitutional: Negative.   HENT: Negative.    Eyes: Negative.   Respiratory:  Negative for shortness of breath.   Cardiovascular:  Negative for chest pain.  Gastrointestinal: Negative.   Genitourinary: Negative.   Musculoskeletal: Negative.   Skin: Negative.   Neurological: Negative.   Endo/Heme/Allergies: Negative.   Psychiatric/Behavioral: Negative.          Objective    BP 137/75 (BP Location: Left Arm, Patient Position: Sitting, Cuff Size: Large)   Pulse (!) 112   Ht 5' 6  (1.676 m)   Wt 236 lb (107 kg)   SpO2 98%   BMI 38.09 kg/m   Physical Exam Vitals and nursing note reviewed.  Constitutional:      Appearance: Normal appearance.  HENT:     Head: Normocephalic and atraumatic.     Right Ear: External ear normal.     Left Ear: External ear normal.     Nose: Nose normal.     Mouth/Throat:     Mouth: Mucous membranes are moist.     Pharynx: Oropharynx is clear.  Eyes:     Extraocular Movements: Extraocular movements intact.     Conjunctiva/sclera: Conjunctivae normal.     Pupils: Pupils are equal, round, and reactive to light.  Cardiovascular:     Rate and Rhythm: Regular rhythm. Tachycardia present.     Pulses: Normal pulses.     Heart sounds: Normal heart sounds.  Pulmonary:     Effort: Pulmonary effort is normal.     Breath sounds: Normal breath sounds.  Musculoskeletal:        General: Normal range of motion.     Cervical back: Normal range of motion and neck supple.  Skin:    General: Skin is warm and dry.  Neurological:     General: No focal deficit present.     Mental Status: He is alert and oriented to person, place, and time.  Psychiatric:        Attention and Perception: Attention normal.        Mood and Affect: Affect is angry.        Speech: Speech normal.        Behavior: Behavior is agitated.        Thought Content: Thought content normal.        Cognition and Memory: Cognition and memory normal.        Judgment: Judgment normal.         Assessment & Plan:   Problem List Items Addressed This Visit       Other   Alcohol abuse   Marijuana abuse   Tobacco abuse   Relevant Medications   nicotine  (NICODERM CQ  - DOSED IN MG/24 HOURS) 21 mg/24hr patch   Seasonal allergies   Relevant Medications   loratadine  (CLARITIN ) 10 MG tablet   Polyethylene Glycol 400 (VISINE  DRY EYE RELIEF) 1 % SOLN   GAD (generalized anxiety disorder) - Primary   Relevant Medications   traZODone  (DESYREL ) 50 MG tablet   mirtazapine   (REMERON ) 15 MG tablet   Multiple Vitamin (MULTIVITAMIN) TABS   Psychophysiological insomnia   Relevant Medications   traZODone  (DESYREL ) 50  MG tablet   mirtazapine  (REMERON ) 15 MG tablet   Anemia   Relevant Orders   CBC with Differential/Platelet   Iron, TIBC and Ferritin Panel   History of heart artery stent   Relevant Medications   aspirin  325 MG tablet   Other Visit Diagnoses     Tachycardia       Relevant Orders   TSH   Elevated blood pressure reading in office with diagnosis of hypertension       Relevant Medications   aspirin  325 MG tablet   Screening PSA (prostate specific antigen)       Relevant Orders   PSA      1. GAD (generalized anxiety disorder) Continue current regimen.  Patient strongly encouraged to return to mobile unit after he returns home for further evaluation.  Patient education given on  financial assistance. - Multiple Vitamin (MULTIVITAMIN) TABS; Take 1 tablet by mouth daily.  Dispense: 30 tablet; Refill: 1  2. Psychophysiological insomnia Continue current regimen - traZODone  (DESYREL ) 50 MG tablet; Take 1 tablet (50 mg total) by mouth at bedtime as needed for sleep.  Dispense: 30 tablet; Refill: 1 - mirtazapine  (REMERON ) 15 MG tablet; Take 1 tablet (15 mg total) by mouth at bedtime.  Dispense: 30 tablet; Refill: 1  3. Tachycardia Patient declined to have labs completed after agreeing to them with this provider.  Patient would not allow nurse to complete lab draw.  Patient encouraged to have labs completed soon as possible, patient declined to have an appointment scheduled for lab draw. - TSH; Future  4. Anemia, unspecified type Patient strongly encouraged to complete follow-up, red flags given for prompt reevaluation - CBC with Differential/Platelet; Future - Iron, TIBC and Ferritin Panel; Future  5. History of heart artery stent Continue current regimen - aspirin  325 MG tablet; Take 1 tablet (325 mg total) by mouth daily.   Dispense: 30 tablet; Refill: 1  6. Elevated blood pressure reading in office with diagnosis of hypertension Patient strongly encouraged to check blood pressure at home, keep a written log and have available for all office visits.  Hypertension was on patient's problem list, however patient strongly disagree he had ever been diagnosed with hypertension.  7. Alcohol abuse Currently in substance abuse treatment program  8. Marijuana abuse   9. Tobacco abuse  - nicotine  (NICODERM CQ  - DOSED IN MG/24 HOURS) 21 mg/24hr patch; Place 1 patch (21 mg total) onto the skin daily.  Dispense: 28 patch; Refill: 1  10. Screening PSA (prostate specific antigen)  - PSA; Future  11. Seasonal allergies Continue current regimen - loratadine  (CLARITIN ) 10 MG tablet; Take 1 tablet (10 mg total) by mouth daily.  Dispense: 30 tablet; Refill: 1 - Polyethylene Glycol 400 (VISINE  DRY EYE RELIEF) 1 % SOLN; One drop in each eye q6hrs PRN  Dispense: 15 mL; Refill: 1   I have reviewed the patient's medical history (PMH, PSH, Social History, Family History, Medications, and allergies) , and have been updated if relevant. I spent 30 minutes reviewing chart and  face to face time with patient.    Return for with [facility] in 2-4 weeks .   Etter [place] Mayers, PA-C

## 2023-01-07 ENCOUNTER — Encounter: Payer: Self-pay | Admitting: Physician Assistant

## 2023-01-07 DIAGNOSIS — J302 Other seasonal allergic rhinitis: Secondary | ICD-10-CM | POA: Insufficient documentation

## 2023-01-07 DIAGNOSIS — Z955 Presence of coronary angioplasty implant and graft: Secondary | ICD-10-CM | POA: Insufficient documentation

## 2023-01-07 DIAGNOSIS — F5104 Psychophysiologic insomnia: Secondary | ICD-10-CM | POA: Insufficient documentation

## 2023-01-07 DIAGNOSIS — F1010 Alcohol abuse, uncomplicated: Secondary | ICD-10-CM | POA: Insufficient documentation

## 2023-01-07 DIAGNOSIS — D649 Anemia, unspecified: Secondary | ICD-10-CM | POA: Insufficient documentation

## 2023-01-07 DIAGNOSIS — Z720 Tobacco use: Secondary | ICD-10-CM | POA: Insufficient documentation

## 2023-01-07 DIAGNOSIS — F1210 Cannabis abuse, uncomplicated: Secondary | ICD-10-CM | POA: Insufficient documentation

## 2023-01-07 DIAGNOSIS — F411 Generalized anxiety disorder: Secondary | ICD-10-CM | POA: Insufficient documentation

## 2023-02-26 ENCOUNTER — Telehealth: Payer: Self-pay

## 2023-02-26 NOTE — Telephone Encounter (Signed)
Patient was last seen on the Mobile unit on 05.06.2024. Labs were ordered from the provider DOS. Patient did not go for labs. Vm Left offering a follow up visit for blood work.

## 2024-02-19 ENCOUNTER — Ambulatory Visit (HOSPITAL_COMMUNITY)
Admission: EM | Admit: 2024-02-19 | Discharge: 2024-02-19 | Disposition: A | Discharge status: Home / Self Care | Payer: MEDICAID | Attending: Family Medicine | Admitting: Family Medicine

## 2024-02-19 ENCOUNTER — Encounter (HOSPITAL_COMMUNITY): Payer: Self-pay

## 2024-02-19 DIAGNOSIS — I10 Essential (primary) hypertension: Secondary | ICD-10-CM

## 2024-02-19 DIAGNOSIS — M545 Low back pain, unspecified: Secondary | ICD-10-CM | POA: Diagnosis not present

## 2024-02-19 MED ORDER — IBUPROFEN 800 MG PO TABS
ORAL_TABLET | ORAL | Status: AC
  Filled 2024-02-19: qty 1

## 2024-02-19 MED ORDER — ACETAMINOPHEN 325 MG PO TABS: 650.0000 mg | ORAL_TABLET | Freq: Four times a day (QID) | ORAL | 0 refills | Status: AC | PRN

## 2024-02-19 MED ORDER — DEXAMETHASONE SODIUM PHOSPHATE 10 MG/ML IJ SOLN: 10.0000 mg | Freq: Once | INTRAMUSCULAR | Status: DC

## 2024-02-19 MED ORDER — AMLODIPINE BESYLATE 5 MG PO TABS: 5.0000 mg | ORAL_TABLET | Freq: Every day | ORAL | 1 refills | Status: AC

## 2024-02-19 MED ORDER — IBUPROFEN 800 MG PO TABS: 800.0000 mg | ORAL_TABLET | Freq: Three times a day (TID) | ORAL | 0 refills | Status: AC

## 2024-02-19 MED ORDER — IBUPROFEN 800 MG PO TABS
800.0000 mg | ORAL_TABLET | Freq: Once | ORAL | Status: AC
Start: 2024-02-19 — End: 2024-02-19
  Administered 2024-02-19: 800 mg via ORAL

## 2024-02-19 MED ORDER — METHOCARBAMOL 500 MG PO TABS
1000.0000 mg | ORAL_TABLET | Freq: Three times a day (TID) | ORAL | 0 refills | Status: AC | PRN
Start: 2024-02-19 — End: ?

## 2024-02-19 NOTE — ED Triage Notes (Signed)
 Patient presenting with back pain onset 2 weeks ago ongoing. No known falls or injuries. Patient states his blood pressure and pulse have been elevated as well.  Prescriptions or OTC medications tried: Yes- ibuprofen  800, Tylenol     with no relief

## 2024-02-20 ENCOUNTER — Encounter: Payer: Self-pay | Admitting: Physician Assistant

## 2024-02-24 NOTE — ED Provider Notes (Signed)
 [facility]   [id number] 02/19/24 Arrival Time: 1744  ASSESSMENT & PLAN:  1. Elevated blood pressure reading with diagnosis of hypertension   2. Acute bilateral low back pain without sciatica    Refilled BP medication. Robaxin /Tylenol /Advil  for back pain.  Meds ordered this encounter  Medications   ibuprofen  (ADVIL ) tablet 800 mg   DISCONTD: dexamethasone (DECADRON) injection 10 mg   methocarbamol  (ROBAXIN ) 500 MG tablet    Sig: Take 2 tablets (1,000 mg total) by mouth every 8 (eight) hours as needed for muscle spasms.    Dispense:  30 tablet    Refill:  0   amLODipine (NORVASC) 5 MG tablet    Sig: Take 1 tablet (5 mg total) by mouth daily.    Dispense:  30 tablet    Refill:  1   acetaminophen  (TYLENOL ) 325 MG tablet    Sig: Take 2 tablets (650 mg total) by mouth every 6 (six) hours as needed.    Dispense:  30 tablet    Refill:  0   ibuprofen  (ADVIL ) 800 MG tablet    Sig: Take 1 tablet (800 mg total) by mouth 3 (three) times daily with meals.    Dispense:  21 tablet    Refill:  0   Encouraged mobility for back pain.   Follow-up Information     [facility].   Specialty: Urgent Care Why: To recheck your blood pressure in the next 5-7 days. Contact information: [address] [place]  [address] [phone]        [facility] at [facility].   Specialty: Emergency Medicine Why: If symptoms worsen in any way. Contact information: [address] [place]  [phone]                Reviewed expectations re: course of current medical issues. Questions answered. Outlined signs and symptoms indicating need for more acute intervention. Patient verbalized understanding. After Visit Summary given.   SUBJECTIVE:  [name] [name] is a 53 y.o. male who is presenting with back pain; onset 2 weeks ago; gradual and still ongoing. Denies falls or  injuries. Patient states his blood pressure and pulse have been elevated as well.  Prescriptions or OTC medications tried: Yes- ibuprofen  800, Tylenol     with no relief Requests refill of BP medication.  Denies symptoms of chest pain, palpations, orthopnea, nocturnal dyspnea, or LE edema.  Social History   Tobacco Use  Smoking Status Every Day   Current packs/day: 1.00   Types: Cigarettes  Smokeless Tobacco Never     OBJECTIVE:  Vitals:   02/19/24 1913  BP: (!) 156/88  Pulse: 84  Resp: 18  Temp: 99.3 F (37.4 C)  TempSrc: Oral  SpO2: 97%  Height: 5' 6 (1.676 m)    General appearance: alert; no distress Eyes: PERRLA; EOMI HENT: normocephalic; atraumatic Neck: supple Lungs: clear to auscultation bilaterally Heart: regular rate and rhythm without murmer Chest Wall: non-tender Back: vague soreness over paraspinal musculature, mostly mid to lower and bilateral Abdomen: soft, non-tender; bowel sounds normal Extremities: no edema; symmetrical with no gross deformities Skin: warm and dry Psychological: alert and cooperative; normal mood and affect  ECG: Orders placed or performed during the hospital encounter of 02/19/24   ED EKG   ED EKG    Labs: Results for orders placed or performed during the hospital encounter of 02/23/21  HIV antibody   Collection Time: 02/23/21  2:56  PM  Result Value Ref Range   HIV Screen 4th Generation wRfx Non Reactive Non Reactive  RPR   Collection Time: 02/23/21  2:56 PM  Result Value Ref Range   RPR Ser Ql NON REACTIVE NON REACTIVE  Cytology (oral, anal, urethral) ancillary only   Collection Time: 02/23/21  3:08 PM  Result Value Ref Range   Neisseria Gonorrhea Negative    Chlamydia Negative    Trichomonas Negative    Comment Normal Reference Range Trichomonas - Negative    Comment Normal Reference Ranger Chlamydia - Negative    Comment      Normal Reference Range Neisseria Gonorrhea - Negative   Labs Reviewed - No data to  display  Imaging: No results found.  No Known Allergies  Past Medical History:  Diagnosis Date   Benign essential HTN    CAD (coronary artery disease)    Unstable angina (HCC)    Social History   Socioeconomic History   Marital status: Married    Spouse name: Not on file   Number of children: Not on file   Years of education: Not on file   Highest education level: Not on file  Occupational History   Not on file  Tobacco Use   Smoking status: Every Day    Current packs/day: 1.00    Types: Cigarettes   Smokeless tobacco: Never  Vaping Use   Vaping status: Never Used  Substance and Sexual Activity   Alcohol use: Yes   Drug use: Never    Types: Marijuana    Comment: daily   Sexual activity: Yes  Other Topics Concern   Not on file  Social History Narrative   ** Merged History Encounter **       Social Drivers of Corporate investment banker Strain: Not on file  Food Insecurity: Not on file  Transportation Needs: Not on file  Physical Activity: Not on file  Stress: Not on file  Social Connections: Not on file  Intimate Partner Violence: Not on file   History reviewed. No pertinent family history. Past Surgical History:  Procedure Laterality Date   CARDIAC CATHETERIZATION  05/15/07   with stenting   cardiac stents     CORONARY STENT PLACEMENT         Rolinda Rogue, MD 02/24/24 1159

## 2024-08-09 ENCOUNTER — Ambulatory Visit (HOSPITAL_COMMUNITY)
Admission: EM | Admit: 2024-08-09 | Discharge: 2024-08-09 | Disposition: A | Discharge status: Home / Self Care | Payer: MEDICAID | Attending: Family | Admitting: Family

## 2024-08-09 DIAGNOSIS — Z789 Other specified health status: Secondary | ICD-10-CM

## 2024-08-09 DIAGNOSIS — Z1339 Encounter for screening examination for other mental health and behavioral disorders: Secondary | ICD-10-CM

## 2024-08-09 NOTE — Discharge Instructions (Signed)
 Patient is instructed prior to discharge to:  Take all medications IF prescribed by his/her mental healthcare provider. Report any adverse effects and or reactions from the medicines to his/her outpatient provider promptly. Keep all scheduled appointments, to ensure that you are getting refills on time and to avoid any interruption in your medication.  If you are unable to keep an appointment call to reschedule.  Be sure to follow-up with resources and follow-up appointments provided.  Patient has been instructed & cautioned: To not engage in alcohol and or illegal drug use while on prescription medicines. In the event of worsening symptoms, patient is instructed to call the crisis hotline, 911 and or go to the nearest ED for appropriate evaluation and treatment of symptoms. To follow-up with his/her primary care provider for your other medical issues, concerns and or health care needs.  Information: -National Suicide Prevention Lifeline 1-800-SUICIDE or 219-519-3521.  -988 offers 24/7 access to trained crisis counselors who can help people experiencing mental health-related distress. People can call or text 988 or chat 988lifeline.org for themselves or if they are worried about a loved one who may need crisis support.

## 2024-08-09 NOTE — Progress Notes (Signed)
   08/09/24 1558  [facility] Triage Screening (Walk-ins at [facility] only)  How Did You Hear About Us ? Legal System Psychologist, Educational)  What Is the Reason for Your Visit/Call Today? [name] [name] is a 53 year old male who reports being on probation and has a new engineer, drilling. He states that his probation officer instructed him to just go get a mental health check. [name] [name] mentions that the probation officer is new and doesn't know him, and he expresses confusion about why he is required to be here. He further states that the probation officer told him she would violate him if he didn't come for an assessment, and he does not want to get into trouble. He denies experiencing any significant stressors and reports no mental health concerns. He denies symptoms of depression, anxiety, suicidal ideation (SI), homicidal ideation (HI), or auditory/visual hallucinations (AVH). He does not appear paranoid and responds appropriately to questions. [name] [name] acknowledges a history of occasional alcohol use, specifically beer, and mentions previous use of THC. He denies any history of inpatient treatment and reports that he does not have a therapist or psychiatrist. He currently resides in a room and board house. Additionally, he does not know his probation officer's name or contact number.  How Long Has This Been Causing You Problems? <Week  Have You Recently Had Any Thoughts About Hurting Yourself? No  Have you Recently Had Thoughts About Hurting Someone [name] [name]? No  Are You Planning To Harm Someone At This Time? No  Physical Abuse Denies  Verbal Abuse Denies  Sexual Abuse Denies  Exploitation of patient/patient's resources Denies  Self-Neglect Denies  Are you currently experiencing any auditory, visual or other hallucinations? No  Have You Used Any Alcohol or Drugs in the Past 24 Hours? No  Do you have any current medical co-morbidities that require immediate attention? No  Clinician description of patient  physical appearance/behavior: Patient is calm and cooperative.  What Do You Feel Would Help You the Most Today? Alcohol or Drug Use Treatment  If access to [facility] was not available, would you have sought care in the Emergency Department? Yes  Determination of Need Routine (7 days)  Options For Referral Medication Management;Outpatient Therapy

## 2024-08-09 NOTE — ED Provider Notes (Signed)
 Behavioral Health Urgent Care Medical Screening Exam  Patient Name: [name] [name] MRN: [id number] Date of Evaluation: 08/09/24 Chief Complaint:   Diagnosis:  Final diagnoses:  Need for community resource  Encounter for screening examination for other mental health and behavioral disorders    History of Present illness: [name] [name] is a 53 y.o. male.  Patient states I have a new probation officer now who is doing everything back up.  She said I must get checked out for my mental health.  Ansh endorses history of polysubstance use disorder, alcohol use disorder.  Denies recent substance use aside from alcohol.  Drinks 1-2 drinks average of once per month.  Avish is reportedly prescribed hydrocodone  by primary care provider related to chronic pain.  He uses this medication exactly as prescribed.  Patient is not linked with outpatient psychiatry.  No current medications to address mood.  He denies history of inpatient psychiatric hospitalization.  Denies family mental health and addiction history.  Patient does not plan to follow-up with outpatient psychiatry currently.  He will follow-up with outpatient psychiatry if mood deteriorates.  Rhylen denies SI/HI/AVH.  No history of suicide attempts, no history of nonsuicidal self-harm behavior.  There is no evidence of delusional thought content no indication of patient is responding to internal stimuli.  Patient resides in [place] with a friend.  He denies access to weapons.  He is employed on a part-time basis.   Patient educated and verbalizes understanding of mental health resources and other crisis services in the community. They are instructed to call 911 and present to the nearest emergency room should patient experience any suicidal/homicidal ideation, auditory/visual/hallucinations, or detrimental worsening of mental health condition.       Flowsheet Row ED from 08/09/2024 in [facility] from 02/19/2024 in  [facility] ED from 09/19/2022 in [facility]  C-SSRS RISK CATEGORY No Risk No Risk No Risk    Psychiatric Specialty Exam  Presentation  General Appearance:Appropriate for Environment; Casual  Eye Contact:Good  Speech:Clear and Coherent; Normal Rate  Speech Volume:Normal  Handedness:Right   Mood and Affect  Mood: Euthymic  Affect: Appropriate; Congruent   Thought Process  Thought Processes: Coherent; Goal Directed; Linear  Descriptions of Associations:Intact  Orientation:Full (Time, Place and Person)  Thought Content:Logical; WDL  Diagnosis of Schizophrenia or Schizoaffective disorder in past: No data recorded  Hallucinations:None  Ideas of Reference:None  Suicidal Thoughts:No  Homicidal Thoughts:No   Sensorium  Memory: Immediate Good; Recent Good  Judgment: Good  Insight: Fair   Art Therapist  Concentration: Good  Attention Span: Good  Recall: Good  Fund of Knowledge: Fair  Language: Fair   Psychomotor Activity  Psychomotor Activity: Normal   Assets  Assets: Manufacturing Systems Engineer; Housing; Leisure Time; Vocational/Educational; Financial Resources/Insurance   Sleep  Sleep: Good  Number of hours: No data recorded  Physical Exam: Physical Exam Vitals and nursing note reviewed.  Constitutional:      Appearance: Normal appearance. He is well-developed. He is obese.  HENT:     Head: Normocephalic and atraumatic.     Nose: Nose normal.  Cardiovascular:     Rate and Rhythm: Normal rate.  Pulmonary:     Effort: Pulmonary effort is normal.  Musculoskeletal:        General: Normal range of motion.     Cervical back: Normal range of motion.  Skin:    General: Skin is warm and dry.  Neurological:  Mental Status: He is alert and oriented to person, place, and time.  Psychiatric:        Attention and Perception: Attention and perception normal.        Mood and  Affect: Mood and affect normal.        Speech: Speech normal.        Behavior: Behavior normal. Behavior is cooperative.        Thought Content: Thought content normal.        Cognition and Memory: Cognition and memory normal.        Judgment: Judgment normal.    Review of Systems  Constitutional: Negative.   HENT: Negative.    Eyes: Negative.   Respiratory: Negative.    Cardiovascular: Negative.   Gastrointestinal: Negative.   Genitourinary: Negative.   Musculoskeletal: Negative.   Skin: Negative.   Neurological: Negative.   Psychiatric/Behavioral: Negative.     Blood pressure 110/72, pulse 97, temperature 99.8 F (37.7 C), temperature source Oral, resp. rate 18, SpO2 97%. There is no height or weight on file to calculate BMI.  Musculoskeletal: Strength & Muscle Tone: within normal limits Gait & Station: normal Patient leans: N/A   [facility] MSE Discharge Disposition for Follow up and Recommendations: Based on my evaluation the patient does not appear to have an emergency medical condition and can be discharged with resources and follow up care in outpatient services for Individual Therapy if elected. Patient to follow-up with outpatient psychiatry, resources provided, as necessary. Continue to follow-up with primary care provider.  Continue current medications.  Ellouise LITTIE Dawn, FNP 08/09/2024, 4:49 PM

## 2024-08-09 NOTE — ED Notes (Signed)
 Patient Is discharging at this time. Printed AVS reviewed with patient by provider along with resources. Valuables/belongings returned to patient. No s/s of current distress.
